# Patient Record
Sex: Female | Born: 1970 | Race: White | Hispanic: No | Marital: Married | Smoking: Never smoker
Health system: Southern US, Community
[De-identification: ages and names within clinical notes are randomized; demographics above are authoritative.]

## PROBLEM LIST (undated history)

## (undated) DIAGNOSIS — A6000 Herpesviral infection of urogenital system, unspecified: Secondary | ICD-10-CM

## (undated) DIAGNOSIS — K219 Gastro-esophageal reflux disease without esophagitis: Secondary | ICD-10-CM

## (undated) DIAGNOSIS — N900 Mild vulvar dysplasia: Secondary | ICD-10-CM

## (undated) DIAGNOSIS — Z9889 Other specified postprocedural states: Secondary | ICD-10-CM

## (undated) DIAGNOSIS — R112 Nausea with vomiting, unspecified: Secondary | ICD-10-CM

## (undated) DIAGNOSIS — N870 Mild cervical dysplasia: Secondary | ICD-10-CM

## (undated) DIAGNOSIS — R896 Abnormal cytological findings in specimens from other organs, systems and tissues: Secondary | ICD-10-CM

## (undated) HISTORY — PX: ABDOMINAL HYSTERECTOMY: SHX81

## (undated) HISTORY — DX: Abnormal cytological findings in specimens from other organs, systems and tissues: R89.6

## (undated) HISTORY — DX: Mild cervical dysplasia: N87.0

## (undated) HISTORY — PX: EYE SURGERY: SHX253

## (undated) HISTORY — PX: TOTAL ABDOMINAL HYSTERECTOMY W/ BILATERAL SALPINGOOPHORECTOMY: SHX83

## (undated) HISTORY — DX: Mild vulvar dysplasia: N90.0

---

## 2009-08-17 ENCOUNTER — Ambulatory Visit: Payer: Self-pay | Admitting: Unknown Physician Specialty

## 2010-08-02 ENCOUNTER — Ambulatory Visit: Payer: Self-pay

## 2011-07-23 ENCOUNTER — Ambulatory Visit: Payer: Self-pay

## 2015-09-15 ENCOUNTER — Other Ambulatory Visit: Payer: Self-pay

## 2015-09-15 ENCOUNTER — Encounter: Payer: Self-pay | Admitting: *Deleted

## 2015-09-15 NOTE — Patient Instructions (Signed)
  Your procedure is scheduled on:09/22/15 Report to Day Surgery. To find out your arrival time please call [phone] between 1PM - 3PM on 09/21/15.  Remember: Instructions that are not followed completely may result in serious medical risk, up to and including death, or upon the discretion of your surgeon and anesthesiologist your surgery may need to be rescheduled.    _x___ 1. Do not eat food or drink liquids after midnight. No gum chewing or hard candies.     _x__ 2. No Alcohol for 24 hours before or after surgery.   ____ 3. Bring all medications with you on the day of surgery if instructed.    __x__ 4. Notify your doctor if there is any change in your medical condition     (cold, fever, infections).     Do not wear jewelry, make-up, hairpins, clips or nail polish.  Do not wear lotions, powders, or perfumes. You may wear deodorant.  Do not shave 48 hours prior to surgery. Men may shave face and neck.  Do not bring valuables to the hospital.    [facility] is not responsible for any belongings or valuables.               Contacts, dentures or bridgework may not be worn into surgery.  Leave your suitcase in the car. After surgery it may be brought to your room.  For patients admitted to the hospital, discharge time is determined by your                treatment team.   Patients discharged the day of surgery will not be allowed to drive home.   Please read over the following fact sheets that you were given:   Surgical Site Infection Prevention   __x__ Take these medicines the morning of surgery with A SIP OF WATER:    1. Zantac the night before and the morning of surgery  2.   3.   4.  5.  6.  ____ Fleet Enema (as directed)   ____ Use CHG Soap as directed  ____ Use inhalers on the day of surgery  ____ Stop metformin 2 days prior to surgery    ____ Take 1/2 of usual insulin dose the night before surgery and none on the morning of surgery.   ____ Stop  Coumadin/Plavix/aspirin on  __x__ Stop Anti-inflammatories on tylenol only   __x__ Stop supplements until after surgery.  May continue multi/ Dit D and Calcium  ____ Bring C-Pap to the hospital.

## 2015-09-22 ENCOUNTER — Ambulatory Visit: Payer: BC Managed Care – PPO | Admitting: Certified Registered Nurse Anesthetist

## 2015-09-22 ENCOUNTER — Encounter: Payer: Self-pay | Admitting: *Deleted

## 2015-09-22 ENCOUNTER — Encounter
Admission: RE | Disposition: A | Discharge status: Home / Self Care | Payer: Self-pay | Source: Ambulatory Visit | Attending: Obstetrics and Gynecology

## 2015-09-22 ENCOUNTER — Ambulatory Visit
Admission: RE | Admit: 2015-09-22 | Discharge: 2015-09-22 | Disposition: A | Discharge status: Home / Self Care | Payer: BC Managed Care – PPO | Source: Ambulatory Visit | Attending: Obstetrics and Gynecology | Admitting: Obstetrics and Gynecology

## 2015-09-22 DIAGNOSIS — N890 Mild vaginal dysplasia: Secondary | ICD-10-CM | POA: Insufficient documentation

## 2015-09-22 DIAGNOSIS — N882 Stricture and stenosis of cervix uteri: Secondary | ICD-10-CM | POA: Insufficient documentation

## 2015-09-22 DIAGNOSIS — Z806 Family history of leukemia: Secondary | ICD-10-CM | POA: Diagnosis not present

## 2015-09-22 DIAGNOSIS — Z8262 Family history of osteoporosis: Secondary | ICD-10-CM | POA: Insufficient documentation

## 2015-09-22 DIAGNOSIS — K219 Gastro-esophageal reflux disease without esophagitis: Secondary | ICD-10-CM | POA: Diagnosis not present

## 2015-09-22 DIAGNOSIS — Z842 Family history of other diseases of the genitourinary system: Secondary | ICD-10-CM | POA: Insufficient documentation

## 2015-09-22 DIAGNOSIS — N870 Mild cervical dysplasia: Secondary | ICD-10-CM | POA: Diagnosis not present

## 2015-09-22 DIAGNOSIS — Z808 Family history of malignant neoplasm of other organs or systems: Secondary | ICD-10-CM | POA: Insufficient documentation

## 2015-09-22 DIAGNOSIS — Z833 Family history of diabetes mellitus: Secondary | ICD-10-CM | POA: Insufficient documentation

## 2015-09-22 DIAGNOSIS — Z8249 Family history of ischemic heart disease and other diseases of the circulatory system: Secondary | ICD-10-CM | POA: Insufficient documentation

## 2015-09-22 DIAGNOSIS — Z881 Allergy status to other antibiotic agents status: Secondary | ICD-10-CM | POA: Diagnosis not present

## 2015-09-22 DIAGNOSIS — Z880 Allergy status to penicillin: Secondary | ICD-10-CM | POA: Insufficient documentation

## 2015-09-22 DIAGNOSIS — Z79899 Other long term (current) drug therapy: Secondary | ICD-10-CM | POA: Insufficient documentation

## 2015-09-22 DIAGNOSIS — Z9889 Other specified postprocedural states: Secondary | ICD-10-CM

## 2015-09-22 HISTORY — DX: Herpesviral infection of urogenital system, unspecified: A60.00

## 2015-09-22 HISTORY — PX: LESION REMOVAL: SHX5196

## 2015-09-22 HISTORY — DX: Nausea with vomiting, unspecified: R11.2

## 2015-09-22 HISTORY — DX: Other specified postprocedural states: Z98.890

## 2015-09-22 HISTORY — PX: COLPOSCOPY: SHX161

## 2015-09-22 HISTORY — DX: Gastro-esophageal reflux disease without esophagitis: K21.9

## 2015-09-22 SURGERY — COLPOSCOPY
Anesthesia: General | Wound class: Clean Contaminated

## 2015-09-22 MED ORDER — DEXAMETHASONE SODIUM PHOSPHATE 4 MG/ML IJ SOLN
8.0000 mg | Freq: Once | INTRAMUSCULAR | Status: DC | PRN
  Filled 2015-09-22: qty 2

## 2015-09-22 MED ORDER — SCOPOLAMINE 1 MG/3DAYS TD PT72
MEDICATED_PATCH | TRANSDERMAL | Status: AC
  Administered 2015-09-22: 1.5 mg via TRANSDERMAL
  Filled 2015-09-22: qty 1

## 2015-09-22 MED ORDER — FENTANYL CITRATE (PF) 100 MCG/2ML IJ SOLN: 25.0000 ug | INTRAMUSCULAR | Status: DC | PRN

## 2015-09-22 MED ORDER — SCOPOLAMINE 1 MG/3DAYS TD PT72
1.0000 | MEDICATED_PATCH | Freq: Once | TRANSDERMAL | Status: DC
  Administered 2015-09-22: 1.5 mg via TRANSDERMAL

## 2015-09-22 MED ORDER — IODINE STRONG (LUGOLS) 5 % PO SOLN
ORAL | Status: DC | PRN
  Administered 2015-09-22: 2 mL

## 2015-09-22 MED ORDER — IBUPROFEN 600 MG PO TABS
600.0000 mg | ORAL_TABLET | Freq: Four times a day (QID) | ORAL | Status: DC | PRN
Start: 2015-09-22 — End: 2016-09-11

## 2015-09-22 MED ORDER — PROPOFOL 10 MG/ML IV BOLUS
INTRAVENOUS | Status: DC | PRN
  Administered 2015-09-22: 150 mg via INTRAVENOUS

## 2015-09-22 MED ORDER — HYDROCODONE-ACETAMINOPHEN 5-325 MG PO TABS: 1.0000 | ORAL_TABLET | Freq: Four times a day (QID) | ORAL | Status: DC | PRN

## 2015-09-22 MED ORDER — LACTATED RINGERS IV SOLN
INTRAVENOUS | Status: DC
Start: 2015-09-22 — End: 2015-09-22
  Administered 2015-09-22: 10:00:00 via INTRAVENOUS

## 2015-09-22 MED ORDER — LIDOCAINE HCL (CARDIAC) 20 MG/ML IV SOLN
INTRAVENOUS | Status: DC | PRN
  Administered 2015-09-22: 30 mg via INTRAVENOUS

## 2015-09-22 MED ORDER — IODINE STRONG (LUGOLS) 5 % PO SOLN
ORAL | Status: AC
  Filled 2015-09-22: qty 1

## 2015-09-22 MED ORDER — MIDAZOLAM HCL 2 MG/2ML IJ SOLN
INTRAMUSCULAR | Status: DC | PRN
  Administered 2015-09-22: 2 mg via INTRAVENOUS

## 2015-09-22 MED ORDER — FENTANYL CITRATE (PF) 100 MCG/2ML IJ SOLN
INTRAMUSCULAR | Status: DC | PRN
  Administered 2015-09-22: 100 ug via INTRAVENOUS

## 2015-09-22 MED ORDER — DEXAMETHASONE SODIUM PHOSPHATE 10 MG/ML IJ SOLN
INTRAMUSCULAR | Status: DC | PRN
  Administered 2015-09-22: 5 mg via INTRAVENOUS

## 2015-09-22 SURGICAL SUPPLY — 28 items
BLADE SURG SZ10 CARB STEEL (BLADE) ×3 IMPLANT
CANISTER SUCT 1200ML W/VALVE (MISCELLANEOUS) ×3 IMPLANT
CATH FOLEY 2WAY  5CC 16FR (CATHETERS) ×2
CATH URTH 16FR FL 2W BLN LF (CATHETERS) ×1 IMPLANT
DRAPE SHEET LG 3/4 BI-LAMINATE (DRAPES) ×3 IMPLANT
DRAPE SURG 17X11 SM STRL (DRAPES) ×3 IMPLANT
DRAPE UNDER BUTTOCK W/FLU (DRAPES) ×3 IMPLANT
ELECT LEEP BALL 5MM 12CM (MISCELLANEOUS) ×3
ELECT REM PT RETURN 9FT ADLT (ELECTROSURGICAL) ×3
ELECTRODE LEEP BALL 5MM 12CM (MISCELLANEOUS) ×1 IMPLANT
ELECTRODE REM PT RTRN 9FT ADLT (ELECTROSURGICAL) ×1 IMPLANT
GAUZE PACK 2X3YD (MISCELLANEOUS) ×3 IMPLANT
GLOVE BIO SURGEON STRL SZ7 (GLOVE) ×3 IMPLANT
GLOVE INDICATOR 7.5 STRL GRN (GLOVE) ×3 IMPLANT
GOWN STRL REUS W/ TWL LRG LVL3 (GOWN DISPOSABLE) ×2 IMPLANT
GOWN STRL REUS W/TWL LRG LVL3 (GOWN DISPOSABLE) ×4
KIT RM TURNOVER CYSTO AR (KITS) ×3 IMPLANT
NDL SAFETY 18GX1.5 (NEEDLE) ×3 IMPLANT
PACK DNC HYST (MISCELLANEOUS) ×3 IMPLANT
PAD OB MATERNITY 4.3X12.25 (PERSONAL CARE ITEMS) ×3 IMPLANT
PAD PREP 24X41 OB/GYN DISP (PERSONAL CARE ITEMS) ×3 IMPLANT
PENCIL ELECTRO HAND CTR (MISCELLANEOUS) ×3 IMPLANT
SPONGE XRAY 4X4 16PLY STRL (MISCELLANEOUS) ×3 IMPLANT
SUT VIC AB 0 CT1 27 (SUTURE) ×2
SUT VIC AB 0 CT1 27XCR 8 STRN (SUTURE) ×1 IMPLANT
SUT VIC AB 0 CT1 36 (SUTURE) ×3 IMPLANT
SYR 30ML LL (SYRINGE) ×3 IMPLANT
SYRINGE 10CC LL (SYRINGE) ×3 IMPLANT

## 2015-09-22 NOTE — Discharge Instructions (Signed)

## 2015-09-22 NOTE — Anesthesia Procedure Notes (Signed)
Procedure Name: LMA Insertion Date/Time: 09/22/2015 10:29 AM Performed by: Derinda LateIACONE, Callum Wolf Pre-anesthesia Checklist: Patient identified, Patient being monitored, Timeout performed, Emergency Drugs available and Suction available Patient Re-evaluated:Patient Re-evaluated prior to inductionOxygen Delivery Method: Circle system utilized Preoxygenation: Pre-oxygenation with 100% oxygen Intubation Type: IV induction Ventilation: Mask ventilation without difficulty LMA: LMA inserted LMA Size: 4.0 Tube type: Oral Number of attempts: 1 Placement Confirmation: positive ETCO2 and breath sounds checked- equal and bilateral Tube secured with: Tape Dental Injury: Teeth and Oropharynx as per pre-operative assessment

## 2015-09-22 NOTE — Transfer of Care (Signed)
Immediate Anesthesia Transfer of Care Note  Patient: Heidi Rodriguez  Procedure(s) Performed: Procedure(s): COLPOSCOPY (N/A) EXCISION VAGINAL LESION (N/A)  Patient Location: PACU  Anesthesia Type:General  Level of Consciousness: awake, alert  and oriented  Airway & Oxygen Therapy: Patient Spontanous Breathing  Post-op Assessment: Report given to RN and Post -op Vital signs reviewed and stable  Post vital signs: Reviewed and stable  Last Vitals:  Filed Vitals:   09/22/15 0859 09/22/15 1107  BP: 116/68   Pulse: 72   Temp: 36.7 C 36.4 C  Resp: 16     Complications: No apparent anesthesia complications

## 2015-09-22 NOTE — H&P (Signed)
Date of Initial paper H&P: 09/13/15  History reviewed, patient examined lung clear, no change in status, stable for surgery.

## 2015-09-22 NOTE — Op Note (Signed)
Preoperative Diagnosis: 1) 45 y.o.  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 with persistent LSIL paps, vaginal lesion revealing VAIN I  Anesthesia: General - LMA  Preoperative Antibiotics: none  Estimated Blood Loss: 2mL  IV Fluids: 500mL  Drains or Tubes: none  Implants: none  Specimens Removed: Left vaginal sidewall lesion, ectocervical biopsy 12 O'Clock, endocervical curettage  Complications: none  Intraoperative Findings: Slightly stenotic cervix secondary to prior LEEP, small 4-185mm condylomatous lesion on the left vaginal sidewall.  Lugols was applied to the cervix and vagina, with the exception of the lesion all other areas stained appropriately.    Patient Condition: stable  Procedure in Detail:  Patient was taken to the operating room where she was administered general anesthesia.  She was positioned in the dorsal lithotomy position utilizing Allen stirups, prepped and draped in the usual sterile fashion.  Prior to proceeding with procedure a time out was performed.  Attention was turned to the patient's pelvis.  An insulated speculum was placed to allow visualization of the cervix.  Lugol's solution was applied to the cervix and vagina with the lesion previously noted being the only non-staining area.  Given the small size of the lesion and its location, rather than using an electrocautery loop, the lesion was excised using a Kevorkian biopsy forceps.  The bed and edges of the lesion were then cauterize using a roller ball.  The cervix was grasped with a single tooth tenaculum, ECC was perfomed yielding a small amount of tissue.  In addition random 12 O'Clock  ectocervical biopsy was obtained.  Biopsy and tenaculum sites were inspected noted to be hemostatic.  The insulated speculum was then removed.  Sponge needle and instrument counts were correct time two.  The patient tolerated the procedure well and was taken to the recovery room in stable condition.

## 2015-09-22 NOTE — Anesthesia Preprocedure Evaluation (Signed)
Anesthesia Evaluation  Patient identified by MRN, date of birth, ID band Patient awake    Reviewed: Allergy & Precautions, NPO status , Patient's Chart, lab work & pertinent test results  History of Anesthesia Complications (+) PONV  Airway Mallampati: I  TM Distance: >3 FB Neck ROM: Full    Dental  (+) Teeth Intact   Pulmonary    Pulmonary exam normal        Cardiovascular Exercise Tolerance: Good negative cardio ROS Normal cardiovascular exam     Neuro/Psych    GI/Hepatic GERD  Medicated and Controlled,  Endo/Other    Renal/GU      Musculoskeletal   Abdominal Normal abdominal exam  (+)   Peds  Hematology   Anesthesia Other Findings   Reproductive/Obstetrics                             Anesthesia Physical Anesthesia Plan  ASA: I  Anesthesia Plan: General   Post-op Pain Management:    Induction: Intravenous  Airway Management Planned: LMA  Additional Equipment:   Intra-op Plan:   Post-operative Plan: Extubation in OR  Informed Consent: I have reviewed the patients History and Physical, chart, labs and discussed the procedure including the risks, benefits and alternatives for the proposed anesthesia with the patient or authorized representative who has indicated his/her understanding and acceptance.     Plan Discussed with: CRNA  Anesthesia Plan Comments:         Anesthesia Quick Evaluation

## 2015-09-22 NOTE — Anesthesia Postprocedure Evaluation (Signed)
Anesthesia Post Note  Patient: Heidi Rodriguez  Procedure(s) Performed: Procedure(s) (LRB): COLPOSCOPY (N/A) EXCISION VAGINAL LESION (N/A)  Patient location during evaluation: PACU Anesthesia Type: General Level of consciousness: awake Pain management: pain level controlled Vital Signs Assessment: post-procedure vital signs reviewed and stable Respiratory status: spontaneous breathing Cardiovascular status: blood pressure returned to baseline Postop Assessment: no headache Anesthetic complications: no    Last Vitals:  Filed Vitals:   09/22/15 1106 09/22/15 1107  BP: 109/59   Pulse: 74   Temp: 36.4 C 36.4 C  Resp: 15     Last Pain:  Filed Vitals:   09/22/15 1110  PainSc: 0-No pain                 Larraine Argo M

## 2015-09-25 LAB — SURGICAL PATHOLOGY

## 2016-08-05 ENCOUNTER — Telehealth: Payer: Self-pay | Admitting: Obstetrics and Gynecology

## 2016-08-05 ENCOUNTER — Other Ambulatory Visit: Payer: Self-pay | Admitting: Obstetrics and Gynecology

## 2016-08-05 MED ORDER — SPIRONOLACTONE 25 MG PO TABS: 50.0000 mg | ORAL_TABLET | Freq: Every day | ORAL | 3 refills | Status: DC

## 2016-08-05 MED ORDER — VALACYCLOVIR HCL 500 MG PO TABS: 500.0000 mg | ORAL_TABLET | Freq: Every day | ORAL | 3 refills | Status: DC

## 2016-08-05 NOTE — Telephone Encounter (Signed)
Has been refilled refilled until next visit which should be due in June 2018 on your desk wont let me escribe

## 2016-08-05 NOTE — Telephone Encounter (Signed)
Please refill/advise

## 2016-08-05 NOTE — Telephone Encounter (Signed)
Pt contacted office for refill request on the following medications:spironolactone (ALDACTONE) 25 MG tablet. Pt is schedule for annual 09/11/16. [facility] [place]. Cb# [phone]

## 2016-08-05 NOTE — Telephone Encounter (Signed)
Pt states she also needs a refill on her  Valtrex to get her to her appointment. KJ CMA

## 2016-09-11 ENCOUNTER — Encounter: Payer: Self-pay | Admitting: Obstetrics and Gynecology

## 2016-09-11 ENCOUNTER — Ambulatory Visit (INDEPENDENT_AMBULATORY_CARE_PROVIDER_SITE_OTHER): Payer: BC Managed Care – PPO | Admitting: Obstetrics and Gynecology

## 2016-09-11 VITALS — BP 112/62 | HR 66 | Ht 62.0 in | Wt 122.0 lb

## 2016-09-11 DIAGNOSIS — Z01419 Encounter for gynecological examination (general) (routine) without abnormal findings: Secondary | ICD-10-CM | POA: Diagnosis not present

## 2016-09-11 DIAGNOSIS — Z1231 Encounter for screening mammogram for malignant neoplasm of breast: Secondary | ICD-10-CM | POA: Diagnosis not present

## 2016-09-11 DIAGNOSIS — Z124 Encounter for screening for malignant neoplasm of cervix: Secondary | ICD-10-CM

## 2016-09-11 DIAGNOSIS — Z1239 Encounter for other screening for malignant neoplasm of breast: Secondary | ICD-10-CM

## 2016-09-11 NOTE — Patient Instructions (Signed)

## 2016-09-11 NOTE — Progress Notes (Signed)
Gynecology Annual Exam  PCP: Cyndie Mull, DO  Chief Complaint:  Chief Complaint  Patient presents with  . Gynecologic Exam    History of Present Illness: Patient is a 46 y.o. G1P1 presents for annual exam. The patient has no complaints today.   LMP: No LMP recorded. Patient has had a hysterectomy.  The patient is sexually active. She currently uses nothing (hysterectomy) for contraception. She denies dyspareunia.  The patient does perform self breast exams.  There is no notable family history of breast or ovarian cancer in her family.  The patient wears seatbelts: yes.   The patient has regular exercise: yes.    The patient denies current symptoms of depression.    The patient did have BSO with her hysterectomy, she has discontinued HRT is doing well without significant vasomotor or vaginal symptoms.  Review of Systems: ROS  Past Medical History:  Past Medical History:  Diagnosis Date  . GERD (gastroesophageal reflux disease)   . Herpes genitalis   . PONV (postoperative nausea and vomiting)     Past Surgical History:  Past Surgical History:  Procedure Laterality Date  . ABDOMINAL HYSTERECTOMY    . COLPOSCOPY N/A 09/22/2015   Procedure: COLPOSCOPY;  Surgeon: Vena Austria, MD;  Location: [facility] ORS;  Service: Gynecology;  Laterality: N/A;  . LESION REMOVAL N/A 09/22/2015   Procedure: EXCISION VAGINAL LESION;  Surgeon: Vena Austria, MD;  Location: [facility] ORS;  Service: Gynecology;  Laterality: N/A;    Gynecologic History:  No LMP recorded. Patient has had a hysterectomy. Contraception: status post hysterectomy Last Pap: Results were: LSIL HPV negative Last mammogram: 1 year ago Results were: BI-RAD I Obstetric History: G1P1  Family History:  Family History  Problem Relation Age of Onset  . Skin cancer Mother 83  . Diabetes Maternal Uncle   . Skin cancer Maternal Grandmother   . Leukemia Maternal Grandmother     Social History:  Social  History   Social History  . Marital status: Divorced    Spouse name: N/A  . Number of children: N/A  . Years of education: N/A   Occupational History  . Not on file.   Social History Main Topics  . Smoking status: Never Smoker  . Smokeless tobacco: Never Used  . Alcohol use No  . Drug use: No  . Sexual activity: Yes    Birth control/ protection: Surgical     Comment: Hysterectomy   Other Topics Concern  . Not on file   Social History Narrative  . No narrative on file    Allergies:  Allergies  Allergen Reactions  . Erythromycin Hives  . Keflex [Cephalexin] Hives    Medications: Prior to Admission medications   Medication Sig Start Date End Date Taking? Authorizing Provider  Biotin 10 MG CAPS Take by mouth.   Yes Historical Provider, MD  calcium carbonate 1250 MG capsule Take 1,250 mg by mouth daily.   Yes Historical Provider, MD  fluticasone (FLONASE) 50 MCG/ACT nasal spray INHALE 1 SPRAY (50 MCG) IN EACH NOSTRIL BY INTRANASAL ROUTE 2 TIMES PER DAY 07/08/16  Yes Historical Provider, MD  Lactobacillus Acid-Pectin (ACIDOPHILUS/PECTIN) CAPS Take by mouth.   Yes Historical Provider, MD  Multiple Vitamin (MULTIVITAMIN) capsule Take 1 capsule by mouth daily.   Yes Historical Provider, MD  naftifine (NAFTIN) 1 % cream Apply 1 application topically daily. Between toes   Yes Historical Provider, MD  spironolactone (ALDACTONE) 25 MG tablet Take 2 tablets (50 mg total) by  mouth daily. 08/05/16  Yes Vena Austria, MD  valACYclovir (VALTREX) 500 MG tablet Take 1 tablet (500 mg total) by mouth daily. 08/05/16  Yes Vena Austria, MD  vitamin C (ASCORBIC ACID) 500 MG tablet Take by mouth.   Yes Historical Provider, MD  Vitamin D, Ergocalciferol, (DRISDOL) 50000 units CAPS capsule Take 50,000 Units by mouth daily.   Yes Historical Provider, MD    Physical Exam Vitals: Blood pressure 112/62, pulse 66, height  (1.575 m), weight 122 lb (55.3 kg).  General: NAD HEENT:  normocephalic, anicteric Thyroid: no enlargement, no palpable nodules Pulmonary: No increased work of breathing, CTAB Cardiovascular: RRR, distal pulses 2+ Breast: Breast symmetrical, no tenderness, no palpable nodules or masses, no skin or nipple retraction present, no nipple discharge.  No axillary or supraclavicular lymphadenopathy. Abdomen: NABS, soft, non-tender, non-distended.  Umbilicus without lesions.  No hepatomegaly, splenomegaly or masses palpable. No evidence of hernia  Genitourinary:  External: Normal external female genitalia.  Normal urethral meatus, normal  Bartholin's and Skene's glands.    Vagina: Normal vaginal mucosa, no evidence of prolapse.    Cervix: Grossly normal in appearance, no bleeding  Uterus: Non-enlarged, mobile, normal contour.  No CMT  Adnexa: ovaries non-enlarged, no adnexal masses  Rectal: deferred  Lymphatic: no evidence of inguinal lymphadenopathy Extremities: no edema, erythema, or tenderness Neurologic: Grossly intact Psychiatric: mood appropriate, affect full  Female chaperone present for pelvic and breast  portions of the physical exam    Assessment: 46 y.o. G1P1 routine annual exam  Plan: Problem List Items Addressed This Visit    None    Visit Diagnoses    Encounter for annual routine gynecological examination    -  Primary   Screening for malignant neoplasm of cervix       Relevant Orders   PapIG, HPV, rfx 16/18   Breast screening       Relevant Orders   MM DIGITAL SCREENING BILATERAL      1) Mammogram - recommend yearly screening mammogram.  Mammogram Was ordered today   2) STI screening was offered and declined  3) ASCCP guidelines and rational discussed.  Given history of VAIN I last year will obtain pap today but if normal consider discontinuation  4) Contraception - N/A  5) Routine healthcare maintenance including cholesterol, diabetes screening discussed managed by PCP   6) DEXA scan - vitamin D and calcium  supplementation.  Consider early DEXA at age 70 given surgical menopause  7) RTC 1 year for annual exam

## 2016-09-13 LAB — HPV, LOW VOLUME (REFLEX)

## 2016-09-13 LAB — PAPIG, HPV, RFX 16/18: PAP SMEAR COMMENT: 0

## 2016-09-26 ENCOUNTER — Ambulatory Visit: Payer: BC Managed Care – PPO

## 2016-09-28 ENCOUNTER — Other Ambulatory Visit: Payer: Self-pay | Admitting: Obstetrics and Gynecology

## 2016-09-30 NOTE — Telephone Encounter (Signed)
AMS, pt has a Colposcopy scheduled with you in May. Please advise on refill. KJ CMA

## 2016-10-02 ENCOUNTER — Ambulatory Visit: Payer: BC Managed Care – PPO

## 2016-10-17 ENCOUNTER — Encounter: Payer: Self-pay | Admitting: Obstetrics and Gynecology

## 2016-10-17 ENCOUNTER — Ambulatory Visit (INDEPENDENT_AMBULATORY_CARE_PROVIDER_SITE_OTHER): Payer: BC Managed Care – PPO | Admitting: Obstetrics and Gynecology

## 2016-10-17 VITALS — BP 116/70 | HR 61 | Wt 119.0 lb

## 2016-10-17 DIAGNOSIS — R87620 Atypical squamous cells of undetermined significance on cytologic smear of vagina (ASC-US): Secondary | ICD-10-CM | POA: Diagnosis not present

## 2016-10-17 DIAGNOSIS — N890 Mild vaginal dysplasia: Secondary | ICD-10-CM

## 2016-10-17 NOTE — Progress Notes (Signed)
GYNECOLOGY CLINIC COLPOSCOPY PROCEDURE NOTE  46 y.o. G1P1 here for colposcopy for atypical squamous cellularity of undetermined significance (ASCUS) pap smear on 1 month ago. Discussed underlying role for HPV infection in the development of cervical dysplasia, its natural history and progression/regression, need for surveillance.  Is the patient  pregnant: No LMP: No LMP recorded. Patient has had a hysterectomy.  Metrics: Intervention Frequency ACO  Documented Smoking Status Yearly  Screened one or more times in 24 months  Cessation Counseling or  Active cessation medication Past 24 months  Past 24 months   Guideline developer: UpToDate (See UpToDate for funding source) Date Released: 2014   Patient given informed consent, signed copy in the chart, time out was performed.  The patient was position in dorsal lithotomy position. Speculum was placed the cervix was visualized.   After application of acetic acid colposcopic inspection of the cervix was undertaken.   Colposcopy adequate, full visualization of transformation zone: transformation zone retracted, able to visualize using small Q-tip to hold os open no visible lesions; corresponding biopsies obtained.   ECC specimen obtained:  Yes limited as not much cervical length given prior supracervical hysterectomy All specimens were labeled and sent to pathology.   Patient was given post procedure instructions.  Will follow up pathology and manage accordingly.  Routine preventative health maintenance measures emphasized.  Physical Exam  Genitourinary:      Vena AustriaAndreas Lovena Kluck, MD, Merlinda FrederickFACOG [facility], [facility]

## 2016-10-21 ENCOUNTER — Encounter: Payer: Self-pay | Admitting: Obstetrics and Gynecology

## 2016-10-21 LAB — PATHOLOGY

## 2016-10-30 ENCOUNTER — Ambulatory Visit
Admission: RE | Admit: 2016-10-30 | Discharge: 2016-10-30 | Disposition: A | Discharge status: Home / Self Care | Payer: BC Managed Care – PPO | Source: Ambulatory Visit | Attending: Obstetrics and Gynecology | Admitting: Obstetrics and Gynecology

## 2016-10-30 DIAGNOSIS — Z1239 Encounter for other screening for malignant neoplasm of breast: Secondary | ICD-10-CM

## 2016-10-30 DIAGNOSIS — Z1231 Encounter for screening mammogram for malignant neoplasm of breast: Secondary | ICD-10-CM | POA: Insufficient documentation

## 2016-11-01 ENCOUNTER — Encounter: Payer: Self-pay | Admitting: Obstetrics and Gynecology

## 2016-12-02 ENCOUNTER — Other Ambulatory Visit: Payer: Self-pay | Admitting: Obstetrics and Gynecology

## 2016-12-02 NOTE — Telephone Encounter (Signed)
Most recently seen by AMS. Would you be able to refill this due to [facility] out of the office? Thank you.

## 2016-12-04 ENCOUNTER — Other Ambulatory Visit: Payer: Self-pay | Admitting: Obstetrics and Gynecology

## 2016-12-11 ENCOUNTER — Encounter: Payer: Self-pay | Admitting: Obstetrics and Gynecology

## 2016-12-12 ENCOUNTER — Other Ambulatory Visit: Payer: Self-pay | Admitting: Obstetrics and Gynecology

## 2016-12-12 NOTE — Telephone Encounter (Signed)
Pt needs gel not crm. Corrected Rx eRxd.

## 2016-12-12 NOTE — Telephone Encounter (Deleted)
You last prescribed this. Thank you.

## 2016-12-12 NOTE — Telephone Encounter (Signed)
Please advise for refill. Thank you.  

## 2016-12-22 ENCOUNTER — Other Ambulatory Visit: Payer: Self-pay | Admitting: Obstetrics and Gynecology

## 2016-12-23 NOTE — Telephone Encounter (Signed)
Colpo was 10/2016. Please advise

## 2017-01-03 ENCOUNTER — Other Ambulatory Visit: Payer: Self-pay

## 2017-01-03 MED ORDER — FLUTICASONE PROPIONATE 50 MCG/ACT NA SUSP: 2.0000 | Freq: Two times a day (BID) | NASAL | 1 refills | Status: DC

## 2017-01-07 ENCOUNTER — Other Ambulatory Visit: Payer: Self-pay | Admitting: Obstetrics and Gynecology

## 2017-02-05 ENCOUNTER — Other Ambulatory Visit: Payer: Self-pay | Admitting: Obstetrics and Gynecology

## 2017-04-03 ENCOUNTER — Other Ambulatory Visit: Payer: Self-pay | Admitting: Obstetrics and Gynecology

## 2017-04-29 ENCOUNTER — Other Ambulatory Visit: Payer: Self-pay

## 2017-04-29 MED ORDER — VALACYCLOVIR HCL 500 MG PO TABS: 500.0000 mg | ORAL_TABLET | Freq: Every day | ORAL | 3 refills | Status: DC

## 2017-07-08 ENCOUNTER — Ambulatory Visit: Payer: Self-pay | Admitting: Family Medicine

## 2017-07-17 ENCOUNTER — Ambulatory Visit: Payer: Self-pay | Admitting: Family Medicine

## 2017-08-25 ENCOUNTER — Other Ambulatory Visit: Payer: Self-pay | Admitting: Obstetrics and Gynecology

## 2017-08-25 MED ORDER — FLUTICASONE PROPIONATE 50 MCG/ACT NA SUSP: 2.0000 | Freq: Two times a day (BID) | NASAL | 0 refills | Status: DC

## 2017-08-25 NOTE — Progress Notes (Signed)
Rx RF till annual 

## 2017-09-04 ENCOUNTER — Ambulatory Visit: Payer: Self-pay | Admitting: Family Medicine

## 2017-09-16 ENCOUNTER — Ambulatory Visit (INDEPENDENT_AMBULATORY_CARE_PROVIDER_SITE_OTHER): Payer: BC Managed Care – PPO | Admitting: Obstetrics and Gynecology

## 2017-09-16 ENCOUNTER — Encounter: Payer: Self-pay | Admitting: Obstetrics and Gynecology

## 2017-09-16 VITALS — BP 96/64 | Ht 62.0 in | Wt 114.0 lb

## 2017-09-16 DIAGNOSIS — Z1231 Encounter for screening mammogram for malignant neoplasm of breast: Secondary | ICD-10-CM | POA: Diagnosis not present

## 2017-09-16 DIAGNOSIS — Z01419 Encounter for gynecological examination (general) (routine) without abnormal findings: Secondary | ICD-10-CM

## 2017-09-16 DIAGNOSIS — Z124 Encounter for screening for malignant neoplasm of cervix: Secondary | ICD-10-CM

## 2017-09-16 DIAGNOSIS — Z1239 Encounter for other screening for malignant neoplasm of breast: Secondary | ICD-10-CM

## 2017-09-16 MED ORDER — VALACYCLOVIR HCL 500 MG PO TABS: 500.0000 mg | ORAL_TABLET | Freq: Every day | ORAL | 3 refills | Status: DC

## 2017-09-16 NOTE — Patient Instructions (Signed)
Preventive Care 18-39 Years, Female Preventive care refers to lifestyle choices and visits with your health care provider that can promote health and wellness. What does preventive care include?  A yearly physical exam. This is also called an annual well check.  Dental exams once or twice a year.  Routine eye exams. Ask your health care provider how often you should have your eyes checked.  Personal lifestyle choices, including: ? Daily care of your teeth and gums. ? Regular physical activity. ? Eating a healthy diet. ? Avoiding tobacco and drug use. ? Limiting alcohol use. ? Practicing safe sex. ? Taking vitamin and mineral supplements as recommended by your health care provider. What happens during an annual well check? The services and screenings done by your health care provider during your annual well check will depend on your age, overall health, lifestyle risk factors, and family history of disease. Counseling Your health care provider may ask you questions about your:  Alcohol use.  Tobacco use.  Drug use.  Emotional well-being.  Home and relationship well-being.  Sexual activity.  Eating habits.  Work and work Statistician.  Method of birth control.  Menstrual cycle.  Pregnancy history.  Screening You may have the following tests or measurements:  Height, weight, and BMI.  Diabetes screening. This is done by checking your blood sugar (glucose) after you have not eaten for a while (fasting).  Blood pressure.  Lipid and cholesterol levels. These may be checked every 5 years starting at age 66.  Skin check.  Hepatitis C blood test.  Hepatitis B blood test.  Sexually transmitted disease (STD) testing.  BRCA-related cancer screening. This may be done if you have a family history of breast, ovarian, tubal, or peritoneal cancers.  Pelvic exam and Pap test. This may be done every 3 years starting at age 40. Starting at age 59, this may be done every 5  years if you have a Pap test in combination with an HPV test.  Discuss your test results, treatment options, and if necessary, the need for more tests with your health care provider. Vaccines Your health care provider may recommend certain vaccines, such as:  Influenza vaccine. This is recommended every year.  Tetanus, diphtheria, and acellular pertussis (Tdap, Td) vaccine. You may need a Td booster every 10 years.  Varicella vaccine. You may need this if you have not been vaccinated.  HPV vaccine. If you are 69 or younger, you may need three doses over 6 months.  Measles, mumps, and rubella (MMR) vaccine. You may need at least one dose of MMR. You may also need a second dose.  Pneumococcal 13-valent conjugate (PCV13) vaccine. You may need this if you have certain conditions and were not previously vaccinated.  Pneumococcal polysaccharide (PPSV23) vaccine. You may need one or two doses if you smoke cigarettes or if you have certain conditions.  Meningococcal vaccine. One dose is recommended if you are age 27-21 years and a first-year college student living in a residence hall, or if you have one of several medical conditions. You may also need additional booster doses.  Hepatitis A vaccine. You may need this if you have certain conditions or if you travel or work in places where you may be exposed to hepatitis A.  Hepatitis B vaccine. You may need this if you have certain conditions or if you travel or work in places where you may be exposed to hepatitis B.  Haemophilus influenzae type b (Hib) vaccine. You may need this if  you have certain risk factors.  Talk to your health care provider about which screenings and vaccines you need and how often you need them. This information is not intended to replace advice given to you by your health care provider. Make sure you discuss any questions you have with your health care provider. Document Released: 07/16/2001 Document Revised: 02/07/2016  Document Reviewed: 03/21/2015 Elsevier Interactive Patient Education  [employer].

## 2017-09-16 NOTE — Progress Notes (Signed)
Gynecology Annual Exam  PCP: Dorcas Carrow, DO  Chief Complaint:  Chief Complaint  Patient presents with  . Gynecologic Exam    History of Present Illness: Patient is a 47 y.o. G1P1 presents for annual exam. The patient has no complaints today.   LMP: No LMP recorded. Patient has had a hysterectomy. and BSO 08/17/2009 for menorrhagia, bilateral dermoid cysts.  Has never had issues with vasomotor symptoms.    The patient is sexually active. She currently uses status post hysterectomy for contraception. She denies dyspareunia.  The patient does perform self breast exams.  There is no notable family history of breast or ovarian cancer in her family.  The patient wears seatbelts: yes.   The patient has regular exercise: yes.    The patient denies current symptoms of depression.    Review of Systems: Review of Systems  Constitutional: Negative for chills and fever.  HENT: Negative for congestion.   Respiratory: Negative for cough and shortness of breath.   Cardiovascular: Negative for chest pain and palpitations.  Gastrointestinal: Negative for abdominal pain, constipation, diarrhea, heartburn, nausea and vomiting.  Genitourinary: Negative for dysuria, frequency and urgency.  Skin: Negative for itching and rash.  Neurological: Negative for dizziness and headaches.  Endo/Heme/Allergies: Negative for polydipsia.  Psychiatric/Behavioral: Negative for depression.    Past Medical History:  Past Medical History:  Diagnosis Date  . GERD (gastroesophageal reflux disease)   . Herpes genitalis   . PONV (postoperative nausea and vomiting)     Past Surgical History:  Past Surgical History:  Procedure Laterality Date  . ABDOMINAL HYSTERECTOMY    . COLPOSCOPY N/A 09/22/2015   Procedure: COLPOSCOPY;  Surgeon: Vena Austria, MD;  Location: [facility];  Service: Gynecology;  Laterality: N/A;  . LESION REMOVAL N/A 09/22/2015   Procedure: EXCISION VAGINAL LESION;  Surgeon: Vena Austria, MD;  Location: [facility];  Service: Gynecology;  Laterality: N/A;    Gynecologic History:  No LMP recorded. Patient has had a hysterectomy. Contraception: status post hysterectomy Last Pap: Results were: 09/11/2016 ASCUS with negative colposcopy 10/17/2016  Last mammogram: 10/30/2016 Results were: BI-RAD I  Obstetric History: G1P1  Family History:  Family History  Problem Relation Age of Onset  . Skin cancer Mother 64  . Diabetes Maternal Uncle   . Skin cancer Maternal Grandmother   . Leukemia Maternal Grandmother   . Breast cancer Neg Hx     Social History:  Social History   Socioeconomic History  . Marital status: Divorced    Spouse name: Not on file  . Number of children: Not on file  . Years of education: Not on file  . Highest education level: Not on file  Occupational History  . Not on file  Social Needs  . Financial resource strain: Not on file  . Food insecurity:    Worry: Not on file    Inability: Not on file  . Transportation needs:    Medical: Not on file    Non-medical: Not on file  Tobacco Use  . Smoking status: Never Smoker  . Smokeless tobacco: Never Used  Substance and Sexual Activity  . Alcohol use: No  . Drug use: No  . Sexual activity: Yes    Birth control/protection: Surgical    Comment: Hysterectomy  Lifestyle  . Physical activity:    Days per week: Not on file    Minutes per session: Not on file  . Stress: Not on file  Relationships  . Social connections:  Talks on phone: Not on file    Gets together: Not on file    Attends religious service: Not on file    Active member of club or organization: Not on file    Attends meetings of clubs or organizations: Not on file    Relationship status: Not on file  . Intimate partner violence:    Fear of current or ex partner: Not on file    Emotionally abused: Not on file    Physically abused: Not on file    Forced sexual activity: Not on file  Other Topics Concern  . Not on file    Social History Narrative  . Not on file    Allergies:  Allergies  Allergen Reactions  . Erythromycin Hives  . Keflex [Cephalexin] Hives    Medications: Prior to Admission medications   Medication Sig Start Date End Date Taking? Authorizing Provider  Biotin 10 MG CAPS Take by mouth.    [provider]  calcium carbonate 1250 MG capsule Take 1,250 mg by mouth daily.    [provider]  fluticasone (FLONASE) 50 MCG/ACT nasal spray Place 2 sprays into both nostrils 2 (two) times daily. 08/25/17   Copland, Alicia B, PA-C  Lactobacillus Acid-Pectin (ACIDOPHILUS/PECTIN) CAPS Take by mouth.    [provider]  Multiple Vitamin (MULTIVITAMIN) capsule Take 1 capsule by mouth daily.    [provider]  naftifine (NAFTIN) 1 % cream APPLY TO THE AFFECTED AND SURROUNDING AREAS OF SKIN BY TOPICAL ROUTE ONCE DAILY 12/02/16   Vena AustriaStaebler, Keona Sheffler, MD  NAFTIN 2 % GEL APPLY DAILY TO AFFECTED AREA(S) PLUS A 0.5 INCH MARGIN TO HEALTHY, SURROUNDING SKIN FOR 2 WEEKS 12/12/16   Copland, Helmut MusterAlicia B, PA-C  spironolactone (ALDACTONE) 25 MG tablet TAKE 2 TABLETS BY MOUTH DAILY 12/23/16   Vena AustriaStaebler, Jalayah Gutridge, MD  valACYclovir (VALTREX) 500 MG tablet Take 1 tablet (500 mg total) by mouth daily. 04/29/17   Vena AustriaStaebler, Amylynn Fano, MD  vitamin C (ASCORBIC ACID) 500 MG tablet Take by mouth.    [provider]  Vitamin D, Ergocalciferol, (DRISDOL) 50000 units CAPS capsule Take 50,000 Units by mouth daily.    [provider]    Physical Exam Blood pressure 96/64, height 5\' 2"  (1.575 m), weight 114 lb (51.7 kg).   General: NAD HEENT: normocephalic, anicteric Thyroid: no enlargement, no palpable nodules Pulmonary: No increased work of breathing, CTAB Cardiovascular: RRR, distal pulses 2+ Breast: Breast symmetrical, no tenderness, no palpable nodules or masses, no skin or nipple retraction present, no nipple discharge.  No axillary or supraclavicular lymphadenopathy. Abdomen:  NABS, soft, non-tender, non-distended.  Umbilicus without lesions.  No hepatomegaly, splenomegaly or masses palpable. No evidence of hernia  Genitourinary:  External: Normal external female genitalia.  Normal urethral meatus, normal Bartholin's and Skene's glands.    Vagina: Normal vaginal mucosa, no evidence of prolapse.    Cervix: Grossly normal in appearance, no bleeding  Uterus: surgically sbent  Adnexa: ovaries surgically absent, no adnexal masses  Rectal: deferred  Lymphatic: no evidence of inguinal lymphadenopathy Extremities: no edema, erythema, or tenderness Neurologic: Grossly intact Psychiatric: mood appropriate, affect full  Female chaperone present for pelvic and breast  portions of the physical exam    Assessment: 47 y.o. G1P1 routine annual exam  Plan: Problem List Items Addressed This Visit    None    Visit Diagnoses    Screening for malignant neoplasm of cervix    -  Primary   Relevant Orders   PapIG, HPV,  rfx 16/18   Breast screening       Relevant Orders   MM DIGITAL SCREENING BILATERAL   Encounter for gynecological examination without abnormal finding       Relevant Orders   PapIG, HPV, rfx 16/18      1) Mammogram - recommend yearly screening mammogram.  Mammogram Was ordered today   2) STI screening  was notoffered and therefore not obtained  3) ASCCP guidelines and rational discussed.  Patient opts for every 3 years screening interval - Follow up ASCUS pap last year  4) Contraception - the patient is currently using  status post hysterectomy.    5) Colonoscopy -- Screening recommended starting at age 40  6) Routine healthcare maintenance including cholesterol, diabetes screening discussed managed by PCP  7) Refill - rx valtrex provided  8) Consider early bone density before age 25 (hysterectomy at age 40 with BSO)  58)  Return in 1 year (on 09/17/2018) for routine annual.   Vena Austria, MD, [facility], [facility] 09/16/2017, 9:21 AM

## 2017-09-19 LAB — PAPIG, HPV, RFX 16/18: PAP SMEAR COMMENT: 0

## 2017-09-19 LAB — HPV, LOW VOLUME (REFLEX): HPV low volume reflex: NEGATIVE

## 2017-09-25 ENCOUNTER — Other Ambulatory Visit: Payer: Self-pay | Admitting: Obstetrics and Gynecology

## 2017-09-26 NOTE — Telephone Encounter (Signed)
Please advise. Thank you

## 2017-09-29 ENCOUNTER — Encounter (INDEPENDENT_AMBULATORY_CARE_PROVIDER_SITE_OTHER): Payer: Self-pay

## 2017-10-14 ENCOUNTER — Ambulatory Visit: Payer: BC Managed Care – PPO | Admitting: Family Medicine

## 2017-10-20 ENCOUNTER — Ambulatory Visit (INDEPENDENT_AMBULATORY_CARE_PROVIDER_SITE_OTHER): Payer: BC Managed Care – PPO | Admitting: Family Medicine

## 2017-10-20 VITALS — BP 91/61 | HR 65 | Temp 97.4°F | Ht 61.0 in | Wt 115.7 lb

## 2017-10-20 DIAGNOSIS — Z23 Encounter for immunization: Secondary | ICD-10-CM | POA: Diagnosis not present

## 2017-10-20 DIAGNOSIS — R87619 Unspecified abnormal cytological findings in specimens from cervix uteri: Secondary | ICD-10-CM

## 2017-10-20 DIAGNOSIS — Z8619 Personal history of other infectious and parasitic diseases: Secondary | ICD-10-CM | POA: Insufficient documentation

## 2017-10-20 NOTE — Patient Instructions (Signed)
HPV (Human Papillomavirus) Vaccine: What You Need to Know  1. Why get vaccinated?  HPV vaccine prevents infection with human papillomavirus (HPV) types that are associated with many cancers, including:  · cervical cancer in females,  · vaginal and vulvar cancers in females,  · anal cancer in females and males,  · throat cancer in females and males, and  · penile cancer in males.    In addition, HPV vaccine prevents infection with HPV types that cause genital warts in both females and males.  In the [place] about 12,000 women get cervical cancer every year, and about 4,000 women die from it. HPV vaccine can prevent most of these cases of cervical cancer.  Vaccination is not a substitute for cervical cancer screening. This vaccine does not protect against all HPV types that can cause cervical cancer. Women should still get regular Pap tests.  HPV infection usually comes from sexual contact, and most people will become infected at some point in their life. About 14 million Americans, including teens, get infected every year. Most infections will go away on their own and not cause serious problems. But thousands of women and men get cancer and other diseases from HPV.  2. HPV vaccine  HPV vaccine is approved by FDA and is recommended by CDC for both males and females. It is routinely given at 11 or 45 years of age, but it may be given beginning at age 9 years through age 26 years.  Most adolescents 9 through 45 years of age should get HPV vaccine as a two-dose series with the doses separated by 6-12 months. People who start HPV vaccination at 15 years of age and older should get the vaccine as a three-dose series with the second dose given 1-2 months after the first dose and the third dose given 6 months after the first dose. There are several exceptions to these age recommendations. Your health care provider can give you more information.  3. Some people should not get this vaccine   · Anyone who has had a severe (life-threatening) allergic reaction to a dose of HPV vaccine should not get another dose.  · Anyone who has a severe (life threatening) allergy to any component of HPV vaccine should not get the vaccine.  · Tell your doctor if you have any severe allergies that you know of, including a severe allergy to yeast.  · HPV vaccine is not recommended for pregnant women. If you learn that you were pregnant when you were vaccinated, there is no reason to expect any problems for you or your baby. Any woman who learns she was pregnant when she got HPV vaccine is encouraged to contact the manufacturer's registry for HPV vaccination during pregnancy at 1-800-986-8999. Women who are breastfeeding may be vaccinated.  · If you have a mild illness, such as a cold, you can probably get the vaccine today. If you are moderately or severely ill, you should probably wait until you recover. Your doctor can advise you.  4. Risks of a vaccine reaction  With any medicine, including vaccines, there is a chance of side effects. These are usually mild and go away on their own, but serious reactions are also possible.  Most people who get HPV vaccine do not have any serious problems with it.  Mild or moderate problems following HPV vaccine:  · Reactions in the arm where the shot was given:  ? Soreness (about 9 people in 10)  ? Redness or swelling (about 1 person   in 3)  · Fever:  ? Mild (100°F) (about 1 person in 10)  ? Moderate (102°F) (about 1 person in 65)  · Other problems:  ? Headache (about 1 person in 3)  Problems that could happen after any injected vaccine:  · People sometimes faint after a medical procedure, including vaccination. Sitting or lying down for about 15 minutes can help prevent fainting, and injuries caused by a fall. Tell your doctor if you feel dizzy, or have vision changes or ringing in the ears.  · Some people get severe pain in the shoulder and have difficulty moving  the arm where a shot was given. This happens very rarely.  · Any medication can cause a severe allergic reaction. Such reactions from a vaccine are very rare, estimated at about 1 in a million doses, and would happen within a few minutes to a few hours after the vaccination.  As with any medicine, there is a very remote chance of a vaccine causing a serious injury or death.  The safety of vaccines is always being monitored. For more information, visit: www.cdc.gov/vaccinesafety/.  5. What if there is a serious reaction?  What should I look for?  Look for anything that concerns you, such as signs of a severe allergic reaction, very high fever, or unusual behavior.  Signs of a severe allergic reaction can include hives, swelling of the face and throat, difficulty breathing, a fast heartbeat, dizziness, and weakness. These would usually start a few minutes to a few hours after the vaccination.  What should I do?  If you think it is a severe allergic reaction or other emergency that can't wait, call 9-1-1 or get to the nearest hospital. Otherwise, call your doctor.  Afterward, the reaction should be reported to the Vaccine Adverse Event Reporting System (VAERS). Your doctor should file this report, or you can do it yourself through the VAERS web site at www.vaers.hhs.gov, or by calling 1-800-822-7967.  VAERS does not give medical advice.  6. The National Vaccine Injury Compensation Program  The National Vaccine Injury Compensation Program (VICP) is a federal program that was created to compensate people who may have been injured by certain vaccines.  Persons who believe they may have been injured by a vaccine can learn about the program and about filing a claim by calling 1-800-338-2382 or visiting the VICP website at www.hrsa.gov/vaccinecompensation. There is a time limit to file a claim for compensation.  7. How can I learn more?  · Ask your health care provider. He or she can give you the vaccine  package insert or suggest other sources of information.  · Call your local or state health department.  · Contact the Centers for Disease Control and Prevention (CDC):  ? Call 1-800-232-4636 (1-800-CDC-INFO) or  ? Visit CDC’s website at www.cdc.gov/hpv  Vaccine Information Statement, HPV Vaccine (05/05/2015)  This information is not intended to replace advice given to you by your health care provider. Make sure you discuss any questions you have with your health care provider.  Document Released: 12/15/2013 Document Revised: 02/08/2016 Document Reviewed: 02/08/2016  Elsevier Interactive Patient Education © 2017 Elsevier Inc.

## 2017-10-20 NOTE — Progress Notes (Signed)
BP 91/61 (BP Location: Right Arm, Patient Position: Sitting, Cuff Size: Normal)   Pulse 65   Temp (!) 97.4 F (36.3 C) (Oral)   Ht  (1.549 m)   Wt 115 lb 11.2 oz (52.5 kg)   SpO2 100%   BMI 21.86 kg/m    Subjective:    Patient ID: Heidi Rodriguez, female    DOB: July 23, 1970, 47 y.o.   MRN: [id number]  HPI: Heidi Rodriguez is a 47 y.o. female  Chief Complaint  Patient presents with  . Establish Care   Had hysterectomy for benign reasons, but still has part of her cervix, so getting paps through GYN. Has been seeing them every 6 months recently. She is otherwise healthy with no other medical problems. Had her last physical in October.   Active Ambulatory Problems    Diagnosis Date Noted  . 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 Abnormal Pap smear of cervix 10/20/2017   Resolved Ambulatory Problems    Diagnosis Date Noted  . No Resolved Ambulatory Problems   Past Medical History:  Diagnosis Date  . Abnormal cytology   . CIN I (cervical intraepithelial neoplasia I)   . GERD (gastroesophageal reflux disease)   . Herpes genitalis   . PONV (postoperative nausea and vomiting)   . Vulvar intraepithelial neoplasia (VIN) grade 1    Past Surgical History:  Procedure Laterality Date  . ABDOMINAL HYSTERECTOMY    . COLPOSCOPY N/A 09/22/2015   Procedure: COLPOSCOPY;  Surgeon: Vena Austria, MD;  Location: [facility];  Service: Gynecology;  Laterality: N/A;  . LESION REMOVAL N/A 09/22/2015   Procedure: EXCISION VAGINAL LESION;  Surgeon: Vena Austria, MD;  Location: [facility];  Service: Gynecology;  Laterality: N/A;   Outpatient Encounter Medications as of 10/20/2017  Medication Sig  . Biotin 10 MG CAPS Take by mouth.  . calcium carbonate 1250 MG capsule Take 1,250 mg by mouth daily.  . Cholecalciferol (VITAMIN D3) 2000 units capsule Take by mouth.  . fluticasone (FLONASE) 50 MCG/ACT nasal spray PLACE 2 SPRAYS INTO BOTH NOSTRILS 2 (TWO) TIMES DAILY.  . Lactobacillus  Acid-Pectin (ACIDOPHILUS/PECTIN) CAPS Take by mouth.  . Multiple Vitamin (MULTIVITAMIN) capsule Take 1 capsule by mouth daily.  Marland Kitchen NAFTIN 2 % GEL APPLY DAILY TO AFFECTED AREA(S) PLUS A 0.5 INCH MARGIN TO HEALTHY, SURROUNDING SKIN FOR 2 WEEKS  . spironolactone (ALDACTONE) 50 MG tablet 2(TWO) TABLET(S) ORAL EVERY DAY  . valACYclovir (VALTREX) 500 MG tablet Take 1 tablet (500 mg total) by mouth daily.  . vitamin C (ASCORBIC ACID) 500 MG tablet Take by mouth.  . Vitamin D, Ergocalciferol, (DRISDOL) 50000 units CAPS capsule Take 50,000 Units by mouth daily.   No facility-administered encounter medications on file as of 10/20/2017.    Allergies  Allergen Reactions  . Erythromycin Hives  . Keflex [Cephalexin] Hives  . Penicillin G     Other reaction(s): Unknown   Social History   Socioeconomic History  . Marital status: Divorced    Spouse name: Not on file  . Number of children: Not on file  . Years of education: Not on file  . Highest education level: Not on file  Occupational History  . Not on file  Social Needs  . Financial resource strain: Not on file  . Food insecurity:    Worry: Not on file    Inability: Not on file  . Transportation needs:    Medical: Not on file    Non-medical: Not on file  Tobacco Use  .  Smoking status: Never Smoker  . Smokeless tobacco: Never Used  Substance and Sexual Activity  . Alcohol use: No  . Drug use: No  . Sexual activity: Yes    Birth control/protection: Surgical    Comment: Hysterectomy  Lifestyle  . Physical activity:    Days per week: Not on file    Minutes per session: Not on file  . Stress: Not on file  Relationships  . Social connections:    Talks on phone: Not on file    Gets together: Not on file    Attends religious service: Not on file    Active member of club or organization: Not on file    Attends meetings of clubs or organizations: Not on file    Relationship status: Not on file  Other Topics Concern  . Not on file    Social History Narrative  . Not on file   Family History  Problem Relation Age of Onset  . Skin cancer Mother 3  . Glaucoma Mother   . Diabetes Maternal Uncle   . Skin cancer Maternal Grandmother   . Leukemia Maternal Grandmother   . CAD Maternal Grandmother   . Glaucoma Maternal Grandmother   . CAD Father   . Hyperlipidemia Father   . Obesity Sister   . Osteoarthritis Sister   . Breast cancer Neg Hx      Review of Systems  Constitutional: Negative.   Respiratory: Negative.   Cardiovascular: Negative.   Genitourinary: Negative.   Psychiatric/Behavioral: Negative.     Per HPI unless specifically indicated above     Objective:    BP 91/61 (BP Location: Right Arm, Patient Position: Sitting, Cuff Size: Normal)   Pulse 65   Temp (!) 97.4 F (36.3 C) (Oral)   Ht  (1.549 m)   Wt 115 lb 11.2 oz (52.5 kg)   SpO2 100%   BMI 21.86 kg/m   Wt Readings from Last 3 Encounters:  10/20/17 115 lb 11.2 oz (52.5 kg)  09/16/17 114 lb (51.7 kg)  10/17/16 119 lb (54 kg)    Physical Exam  Constitutional: She is oriented to person, place, and time. She appears well-developed and well-nourished. No distress.  HENT:  Head: Normocephalic and atraumatic.  Right Ear: Hearing normal.  Left Ear: Hearing normal.  Nose: Nose normal.  Eyes: Conjunctivae and lids are normal. Right eye exhibits no discharge. Left eye exhibits no discharge. No scleral icterus.  Cardiovascular: Normal rate, regular rhythm, normal heart sounds and intact distal pulses. Exam reveals no gallop and no friction rub.  No murmur heard. Pulmonary/Chest: Effort normal and breath sounds normal. No stridor. No respiratory distress. She has no wheezes. She has no rales. She exhibits no tenderness.  Musculoskeletal: Normal range of motion.  Neurological: She is alert and oriented to person, place, and time.  Skin: Skin is warm, dry and intact. Capillary refill takes less than 2 seconds. No rash noted. She is not  diaphoretic. No erythema. No pallor.  Psychiatric: She has a normal mood and affect. Her speech is normal and behavior is normal. Judgment and thought content normal. Cognition and memory are normal.  Nursing note and vitals reviewed.   Results for orders placed or performed in visit on 09/16/17  PapIG, HPV, rfx 16/18  Result Value Ref Range   DIAGNOSIS: Comment    Specimen adequacy: Comment    Clinician Provided ICD10 Comment    Performed by: Comment    Electronically signed by: Comment  PAP Smear Comment .    Note: Comment    Test Methodology Comment    HPV, high-risk CANCELED   HPV, low volume (reflex)  Result Value Ref Range   HPV low volume reflex Negative Negative      Assessment & Plan:   Problem List Items Addressed This Visit      Other   History of HPV infection - Primary    Having paps every 6 months. Following with GYN. Call with any concerns. HPV vaccine given today.      Abnormal Pap smear of cervix    Having paps every 6 months. Following with GYN. Call with any concerns. HPV vaccine given today.       Other Visit Diagnoses    Need for HPV vaccination       #1 given today. Will return in 2 months for #2 and #3.   Relevant Orders   HPV 9-valent vaccine,Recombinat (Completed)   HPV 9-valent vaccine,Recombinat   HPV 9-valent vaccine,Recombinat       Follow up plan: Return 2 month and 6 month nurse visit, for After 10/15 for physical.

## 2017-10-20 NOTE — Assessment & Plan Note (Signed)
Having paps every 6 months. Following with GYN. Call with any concerns. HPV vaccine given today. 

## 2017-10-20 NOTE — Assessment & Plan Note (Signed)
Having paps every 6 months. Following with GYN. Call with any concerns. HPV vaccine given today.

## 2017-10-31 ENCOUNTER — Ambulatory Visit
Admission: RE | Admit: 2017-10-31 | Discharge: 2017-10-31 | Disposition: A | Discharge status: Home / Self Care | Payer: BC Managed Care – PPO | Source: Ambulatory Visit | Attending: Obstetrics and Gynecology | Admitting: Obstetrics and Gynecology

## 2017-10-31 DIAGNOSIS — Z1231 Encounter for screening mammogram for malignant neoplasm of breast: Secondary | ICD-10-CM | POA: Insufficient documentation

## 2017-10-31 DIAGNOSIS — Z1239 Encounter for other screening for malignant neoplasm of breast: Secondary | ICD-10-CM

## 2017-11-03 ENCOUNTER — Encounter (INDEPENDENT_AMBULATORY_CARE_PROVIDER_SITE_OTHER): Payer: Self-pay

## 2017-12-26 ENCOUNTER — Ambulatory Visit (INDEPENDENT_AMBULATORY_CARE_PROVIDER_SITE_OTHER): Payer: BC Managed Care – PPO

## 2017-12-26 ENCOUNTER — Ambulatory Visit: Payer: BC Managed Care – PPO

## 2017-12-26 DIAGNOSIS — Z23 Encounter for immunization: Secondary | ICD-10-CM | POA: Diagnosis not present

## 2018-03-23 ENCOUNTER — Ambulatory Visit (INDEPENDENT_AMBULATORY_CARE_PROVIDER_SITE_OTHER): Payer: BC Managed Care – PPO | Admitting: Family Medicine

## 2018-03-23 ENCOUNTER — Encounter: Payer: Self-pay | Admitting: Family Medicine

## 2018-03-23 VITALS — BP 107/71 | HR 76 | Temp 98.8°F | Ht 61.5 in | Wt 114.9 lb

## 2018-03-23 DIAGNOSIS — Z0000 Encounter for general adult medical examination without abnormal findings: Secondary | ICD-10-CM | POA: Diagnosis not present

## 2018-03-23 DIAGNOSIS — J301 Allergic rhinitis due to pollen: Secondary | ICD-10-CM | POA: Diagnosis not present

## 2018-03-23 DIAGNOSIS — J309 Allergic rhinitis, unspecified: Secondary | ICD-10-CM | POA: Insufficient documentation

## 2018-03-23 DIAGNOSIS — Z23 Encounter for immunization: Secondary | ICD-10-CM | POA: Diagnosis not present

## 2018-03-23 LAB — UA/M W/RFLX CULTURE, ROUTINE
BILIRUBIN UA: NEGATIVE
GLUCOSE, UA: NEGATIVE
KETONES UA: NEGATIVE
Leukocytes, UA: NEGATIVE
Nitrite, UA: NEGATIVE
PROTEIN UA: NEGATIVE
RBC UA: NEGATIVE
SPEC GRAV UA: 1.02 (ref 1.005–1.030)
Urobilinogen, Ur: 0.2 mg/dL (ref 0.2–1.0)
pH, UA: 7 (ref 5.0–7.5)

## 2018-03-23 MED ORDER — FLUTICASONE PROPIONATE 50 MCG/ACT NA SUSP: 2.0000 | Freq: Two times a day (BID) | NASAL | 5 refills | Status: DC

## 2018-03-23 MED ORDER — PREDNISONE 50 MG PO TABS: 50.0000 mg | ORAL_TABLET | Freq: Every day | ORAL | 0 refills | Status: DC

## 2018-03-23 MED ORDER — MONTELUKAST SODIUM 10 MG PO TABS: 10.0000 mg | ORAL_TABLET | Freq: Every day | ORAL | 3 refills | Status: DC

## 2018-03-23 NOTE — Patient Instructions (Addendum)
Influenza (Flu) Vaccine (Inactivated or Recombinant): What You Need to Know 1. Why get vaccinated? Influenza ("flu") is a contagious disease that spreads around the [place] every year, usually between October and May. Flu is caused by influenza viruses, and is spread mainly by coughing, sneezing, and close contact. Anyone can get flu. Flu strikes suddenly and can last several days. Symptoms vary by age, but can include:  fever/chills  sore throat  muscle aches  fatigue  cough  headache  runny or stuffy nose  Flu can also lead to pneumonia and blood infections, and cause diarrhea and seizures in children. If you have a medical condition, such as heart or lung disease, flu can make it worse. Flu is more dangerous for some people. Infants and young children, people 23 years of age and older, pregnant women, and people with certain health conditions or a weakened immune system are at greatest risk. Each year thousands of people in the [place] die from flu, and many more are hospitalized. Flu vaccine can:  keep you from getting flu,  make flu less severe if you do get it, and  keep you from spreading flu to your family and other people. 2. Inactivated and recombinant flu vaccines A dose of flu vaccine is recommended every flu season. Children 6 months through 91 years of age may need two doses during the same flu season. Everyone else needs only one dose each flu season. Some inactivated flu vaccines contain a very small amount of a mercury-based preservative called thimerosal. Studies have not shown thimerosal in vaccines to be harmful, but flu vaccines that do not contain thimerosal are available. There is no live flu virus in flu shots. They cannot cause the flu. There are many flu viruses, and they are always changing. Each year a new flu vaccine is made to protect against three or four viruses that are likely to cause disease in the upcoming flu season. But even when the  vaccine doesn't exactly match these viruses, it may still provide some protection. Flu vaccine cannot prevent:  flu that is caused by a virus not covered by the vaccine, or  illnesses that look like flu but are not.  It takes about 2 weeks for protection to develop after vaccination, and protection lasts through the flu season. 3. Some people should not get this vaccine Tell the person who is giving you the vaccine:  If you have any severe, life-threatening allergies. If you ever had a life-threatening allergic reaction after a dose of flu vaccine, or have a severe allergy to any part of this vaccine, you may be advised not to get vaccinated. Most, but not all, types of flu vaccine contain a small amount of egg protein.  If you ever had Guillain-Barr Syndrome (also called GBS). Some people with a history of GBS should not get this vaccine. This should be discussed with your doctor.  If you are not feeling well. It is usually okay to get flu vaccine when you have a mild illness, but you might be asked to come back when you feel better.  4. Risks of a vaccine reaction With any medicine, including vaccines, there is a chance of reactions. These are usually mild and go away on their own, but serious reactions are also possible. Most people who get a flu shot do not have any problems with it. Minor problems following a flu shot include:  soreness, redness, or swelling where the shot was given  hoarseness  sore,  red or itchy eyes  cough  fever  aches  headache  itching  fatigue  If these problems occur, they usually begin soon after the shot and last 1 or 2 days. More serious problems following a flu shot can include the following:  There may be a small increased risk of Guillain-Barre Syndrome (GBS) after inactivated flu vaccine. This risk has been estimated at 1 or 2 additional cases per million people vaccinated. This is much lower than the risk of severe complications from  flu, which can be prevented by flu vaccine.  Young children who get the flu shot along with pneumococcal vaccine (PCV13) and/or DTaP vaccine at the same time might be slightly more likely to have a seizure caused by fever. Ask your doctor for more information. Tell your doctor if a child who is getting flu vaccine has ever had a seizure.  Problems that could happen after any injected vaccine:  People sometimes faint after a medical procedure, including vaccination. Sitting or lying down for about 15 minutes can help prevent fainting, and injuries caused by a fall. Tell your doctor if you feel dizzy, or have vision changes or ringing in the ears.  Some people get severe pain in the shoulder and have difficulty moving the arm where a shot was given. This happens very rarely.  Any medication can cause a severe allergic reaction. Such reactions from a vaccine are very rare, estimated at about 1 in a million doses, and would happen within a few minutes to a few hours after the vaccination. As with any medicine, there is a very remote chance of a vaccine causing a serious injury or death. The safety of vaccines is always being monitored. For more information, visit: http://www.aguilar.org/ 5. What if there is a serious reaction? What should I look for? Look for anything that concerns you, such as signs of a severe allergic reaction, very high fever, or unusual behavior. Signs of a severe allergic reaction can include hives, swelling of the face and throat, difficulty breathing, a fast heartbeat, dizziness, and weakness. These would start a few minutes to a few hours after the vaccination. What should I do?  If you think it is a severe allergic reaction or other emergency that can't wait, call 9-1-1 and get the person to the nearest hospital. Otherwise, call your doctor.  Reactions should be reported to the Vaccine Adverse Event Reporting System (VAERS). Your doctor should file this report, or you  can do it yourself through the VAERS web site at www.vaers.SamedayNews.es, or by calling 6094730752. ? VAERS does not give medical advice. 6. The National Vaccine Injury Compensation Program The [employer] Vaccine Injury Compensation Program (VICP) is a federal program that was created to compensate people who may have been injured by certain vaccines. Persons who believe they may have been injured by a vaccine can learn about the program and about filing a claim by calling 458-267-6070 or visiting the [place] website at GoldCloset.com.ee. There is a time limit to file a claim for compensation. 7. How can I learn more?  Ask your healthcare provider. He or she can give you the vaccine package insert or suggest other sources of information.  Call your local or state health department.  Contact the Centers for Disease Control and Prevention (CDC): ? Call (540)164-9661 (1-800-CDC-INFO) or ? Visit CDC's website at https://gibson.com/ Vaccine Information Statement, Inactivated Influenza Vaccine (01/07/2014) This information is not intended to replace advice given to you by your health care provider. Make sure  you discuss any questions you have with your health care provider. Document Released: 03/14/2006 Document Revised: 02/08/2016 Document Reviewed: 02/08/2016 Elsevier Interactive Patient Education  2017 [place] Maintenance, Female Adopting a healthy lifestyle and getting preventive care can go a long way to promote health and wellness. Talk with your health care provider about what schedule of regular examinations is right for you. This is a good chance for you to check in with your provider about disease prevention and staying healthy. In between checkups, there are plenty of things you can do on your own. Experts have done a lot of research about which lifestyle changes and preventive measures are most likely to keep you healthy. Ask your health care provider for more  information. Weight and diet Eat a healthy diet  Be sure to include plenty of vegetables, fruits, low-fat dairy products, and lean protein.  Do not eat a lot of foods high in solid fats, added sugars, or salt.  Get regular exercise. This is one of the most important things you can do for your health. ? Most adults should exercise for at least 150 minutes each week. The exercise should increase your heart rate and make you sweat (moderate-intensity exercise). ? Most adults should also do strengthening exercises at least twice a week. This is in addition to the moderate-intensity exercise.  Maintain a healthy weight  Body mass index (BMI) is a measurement that can be used to identify possible weight problems. It estimates body fat based on height and weight. Your health care provider can help determine your BMI and help you achieve or maintain a healthy weight.  For females 8 years of age and older: ? A BMI below 18.5 is considered underweight. ? A BMI of 18.5 to 24.9 is normal. ? A BMI of 25 to 29.9 is considered overweight. ? A BMI of 30 and above is considered obese.  Watch levels of cholesterol and blood lipids  You should start having your blood tested for lipids and cholesterol at 45 years of age, then have this test every 5 years.  You may need to have your cholesterol levels checked more often if: ? Your lipid or cholesterol levels are high. ? You are older than 45 years of age. ? You are at high risk for heart disease.  Cancer screening Lung Cancer  Lung cancer screening is recommended for adults 23-50 years old who are at high risk for lung cancer because of a history of smoking.  A yearly low-dose CT scan of the lungs is recommended for people who: ? Currently smoke. ? Have quit within the past 15 years. ? Have at least a 30-pack-year history of smoking. A pack year is smoking an average of one pack of cigarettes a day for 1 year.  Yearly screening should continue  until it has been 15 years since you quit.  Yearly screening should stop if you develop a health problem that would prevent you from having lung cancer treatment.  Breast Cancer  Practice breast self-awareness. This means understanding how your breasts normally appear and feel.  It also means doing regular breast self-exams. Let your health care provider know about any changes, no matter how small.  If you are in your 20s or 30s, you should have a clinical breast exam (CBE) by a health care provider every 1-3 years as part of a regular health exam.  If you are 31 or older, have a CBE every year. Also consider having a breast X-ray (  mammogram) every year.  If you have a family history of breast cancer, talk to your health care provider about genetic screening.  If you are at high risk for breast cancer, talk to your health care provider about having an MRI and a mammogram every year.  Breast cancer gene (BRCA) assessment is recommended for women who have family members with BRCA-related cancers. BRCA-related cancers include: ? Breast. ? Ovarian. ? Tubal. ? Peritoneal cancers.  Results of the assessment will determine the need for genetic counseling and BRCA1 and BRCA2 testing.  Cervical Cancer Your health care provider may recommend that you be screened regularly for cancer of the pelvic organs (ovaries, uterus, and vagina). This screening involves a pelvic examination, including checking for microscopic changes to the surface of your cervix (Pap test). You may be encouraged to have this screening done every 3 years, beginning at age 71.  For women ages 31-65, health care providers may recommend pelvic exams and Pap testing every 3 years, or they may recommend the Pap and pelvic exam, combined with testing for human papilloma virus (HPV), every 5 years. Some types of HPV increase your risk of cervical cancer. Testing for HPV may also be done on women of any age with unclear Pap test  results.  Other health care providers may not recommend any screening for nonpregnant women who are considered low risk for pelvic cancer and who do not have symptoms. Ask your health care provider if a screening pelvic exam is right for you.  If you have had past treatment for cervical cancer or a condition that could lead to cancer, you need Pap tests and screening for cancer for at least 20 years after your treatment. If Pap tests have been discontinued, your risk factors (such as having a new sexual partner) need to be reassessed to determine if screening should resume. Some women have medical problems that increase the chance of getting cervical cancer. In these cases, your health care provider may recommend more frequent screening and Pap tests.  Colorectal Cancer  This type of cancer can be detected and often prevented.  Routine colorectal cancer screening usually begins at 46 years of age and continues through 45 years of age.  Your health care provider may recommend screening at an earlier age if you have risk factors for colon cancer.  Your health care provider may also recommend using home test kits to check for hidden blood in the stool.  A small camera at the end of a tube can be used to examine your colon directly (sigmoidoscopy or colonoscopy). This is done to check for the earliest forms of colorectal cancer.  Routine screening usually begins at age 8.  Direct examination of the colon should be repeated every 5-10 years through 45 years of age. However, you may need to be screened more often if early forms of precancerous polyps or small growths are found.  Skin Cancer  Check your skin from head to toe regularly.  Tell your health care provider about any new moles or changes in moles, especially if there is a change in a mole's shape or color.  Also tell your health care provider if you have a mole that is larger than the size of a pencil eraser.  Always use sunscreen.  Apply sunscreen liberally and repeatedly throughout the day.  Protect yourself by wearing long sleeves, pants, a wide-brimmed hat, and sunglasses whenever you are outside.  Heart disease, diabetes, and high blood pressure  High blood pressure  causes heart disease and increases the risk of stroke. High blood pressure is more likely to develop in: ? People who have blood pressure in the high end of the normal range (130-139/85-89 mm Hg). ? People who are overweight or obese. ? People who are African American.  If you are 87-72 years of age, have your blood pressure checked every 3-5 years. If you are 45 years of age or older, have your blood pressure checked every year. You should have your blood pressure measured twice-once when you are at a hospital or clinic, and once when you are not at a hospital or clinic. Record the average of the two measurements. To check your blood pressure when you are not at a hospital or clinic, you can use: ? An automated blood pressure machine at a pharmacy. ? A home blood pressure monitor.  If you are between 48 years and 37 years old, ask your health care provider if you should take aspirin to prevent strokes.  Have regular diabetes screenings. This involves taking a blood sample to check your fasting blood sugar level. ? If you are at a normal weight and have a low risk for diabetes, have this test once every three years after 45 years of age. ? If you are overweight and have a high risk for diabetes, consider being tested at a younger age or more often. Preventing infection Hepatitis B  If you have a higher risk for hepatitis B, you should be screened for this virus. You are considered at high risk for hepatitis B if: ? You were born in a country where hepatitis B is common. Ask your health care provider which countries are considered high risk. ? Your parents were born in a high-risk country, and you have not been immunized against hepatitis B (hepatitis B  vaccine). ? You have HIV or AIDS. ? You use needles to inject street drugs. ? You live with someone who has hepatitis B. ? You have had sex with someone who has hepatitis B. ? You get hemodialysis treatment. ? You take certain medicines for conditions, including cancer, organ transplantation, and autoimmune conditions.  Hepatitis C  Blood testing is recommended for: ? Everyone born from [birth year] through 1963. ? Anyone with known risk factors for hepatitis C.  Sexually transmitted infections (STIs)  You should be screened for sexually transmitted infections (STIs) including gonorrhea and chlamydia if: ? You are sexually active and are younger than 45 years of age. ? You are older than 45 years of age and your health care provider tells you that you are at risk for this type of infection. ? Your sexual activity has changed since you were last screened and you are at an increased risk for chlamydia or gonorrhea. Ask your health care provider if you are at risk.  If you do not have HIV, but are at risk, it may be recommended that you take a prescription medicine daily to prevent HIV infection. This is called pre-exposure prophylaxis (PrEP). You are considered at risk if: ? You are sexually active and do not regularly use condoms or know the HIV status of your partner(s). ? You take drugs by injection. ? You are sexually active with a partner who has HIV.  Talk with your health care provider about whether you are at high risk of being infected with HIV. If you choose to begin PrEP, you should first be tested for HIV. You should then be tested every 3 months for as long as  you are taking PrEP. Pregnancy  If you are premenopausal and you may become pregnant, ask your health care provider about preconception counseling.  If you may become pregnant, take 400 to 800 micrograms (mcg) of folic acid every day.  If you want to prevent pregnancy, talk to your health care provider about birth control  (contraception). Osteoporosis and menopause  Osteoporosis is a disease in which the bones lose minerals and strength with aging. This can result in serious bone fractures. Your risk for osteoporosis can be identified using a bone density scan.  If you are 9 years of age or older, or if you are at risk for osteoporosis and fractures, ask your health care provider if you should be screened.  Ask your health care provider whether you should take a calcium or vitamin D supplement to lower your risk for osteoporosis.  Menopause may have certain physical symptoms and risks.  Hormone replacement therapy may reduce some of these symptoms and risks. Talk to your health care provider about whether hormone replacement therapy is right for you. Follow these instructions at home:  Schedule regular health, dental, and eye exams.  Stay current with your immunizations.  Do not use any tobacco products including cigarettes, chewing tobacco, or electronic cigarettes.  If you are pregnant, do not drink alcohol.  If you are breastfeeding, limit how much and how often you drink alcohol.  Limit alcohol intake to no more than 1 drink per day for nonpregnant women. One drink equals 12 ounces of beer, 5 ounces of wine, or 1 ounces of hard liquor.  Do not use street drugs.  Do not share needles.  Ask your health care provider for help if you need support or information about quitting drugs.  Tell your health care provider if you often feel depressed.  Tell your health care provider if you have ever been abused or do not feel safe at home. This information is not intended to replace advice given to you by your health care provider. Make sure you discuss any questions you have with your health care provider. Document Released: 12/03/2010 Document Revised: 10/26/2015 Document Reviewed: 02/21/2015 Elsevier Interactive Patient Education  [employer].

## 2018-03-23 NOTE — Progress Notes (Signed)
BP 107/71   Pulse 76   Temp 98.8 F (37.1 C) (Oral)   Ht 5' 1.5" (1.562 m)   Wt 114 lb 14.4 oz (52.1 kg)   SpO2 96%   BMI 21.36 kg/m    Subjective:    Patient ID: Heidi Rodriguez, female    DOB: 10-13-1970, 47 y.o.   MRN: [id number]  HPI: Heidi Rodriguez is a 47 y.o. female presenting on 03/23/2018 for comprehensive medical examination. Current medical complaints include: Her allergies have really been bothering her for the last week. Has a sore throat and R ear pain. Has been using sudafed with good results.   Menopausal Symptoms: no  Depression Screen done today and results listed below:  Depression screen [facility] 2/9 03/23/2018 10/20/2017  Decreased Interest 0 0  Down, Depressed, Hopeless 0 0  PHQ - 2 Score 0 0  Altered sleeping 0 -  Tired, decreased energy 0 -  Change in appetite 0 -  Feeling bad or failure about yourself  0 -  Trouble concentrating 0 -  Moving slowly or fidgety/restless 0 -  Suicidal thoughts 0 -  PHQ-9 Score 0 -    Past Medical History:  Past Medical History:  Diagnosis Date  . Abnormal cytology   . CIN I (cervical intraepithelial neoplasia I)   . GERD (gastroesophageal reflux disease)   . Herpes genitalis   . PONV (postoperative nausea and vomiting)   . Vulvar intraepithelial neoplasia (VIN) grade 1     Surgical History:  Past Surgical History:  Procedure Laterality Date  . ABDOMINAL HYSTERECTOMY    . COLPOSCOPY N/A 09/22/2015   Procedure: COLPOSCOPY;  Surgeon: Vena Austria, MD;  Location: [facility] ORS;  Service: Gynecology;  Laterality: N/A;  . LESION REMOVAL N/A 09/22/2015   Procedure: EXCISION VAGINAL LESION;  Surgeon: Vena Austria, MD;  Location: [facility] OR[facility];  Service: Gynecology;  Laterality: N/A;    Medications:  Current Outpatient Medications on File Prior to Visit  Medication Sig  . Biotin 10 MG CAPS Take by mouth.  . calcium carbonate 1250 MG capsule Take 1,250 mg by mouth daily.  . Cholecalciferol (VITAMIN D3) 2000 units capsule  Take by mouth.  . Lactobacillus Acid-Pectin (ACIDOPHILUS/PECTIN) CAPS Take by mouth.  . Multiple Vitamin (MULTIVITAMIN) capsule Take 1 capsule by mouth daily.  Marland Kitchen NAFTIN 2 % GEL APPLY DAILY TO AFFECTED AREA(S) PLUS A 0.5 INCH MARGIN TO HEALTHY, SURROUNDING SKIN FOR 2 WEEKS  . ranitidine (ZANTAC) 75 MG tablet Take 75 mg by mouth daily as needed for heartburn.  . spironolactone (ALDACTONE) 50 MG tablet 2(TWO) TABLET(S) ORAL EVERY DAY  . valACYclovir (VALTREX) 500 MG tablet Take 1 tablet (500 mg total) by mouth daily.  . vitamin C (ASCORBIC ACID) 500 MG tablet Take by mouth.   No current facility-administered medications on file prior to visit.     Allergies:  Allergies  Allergen Reactions  . Erythromycin Hives  . Keflex [Cephalexin] Hives  . Penicillin G     Other reaction(s): Unknown    Social History:  Social History   Socioeconomic History  . Marital status: Married    Spouse name: Not on file  . Number of children: Not on file  . Years of education: Not on file  . Highest education level: Not on file  Occupational History  . Not on file  Social Needs  . Financial resource strain: Not on file  . Food insecurity:    Worry: Not on file    Inability: Not on  file  . Transportation needs:    Medical: Not on file    Non-medical: Not on file  Tobacco Use  . Smoking status: Never Smoker  . Smokeless tobacco: Never Used  Substance and Sexual Activity  . Alcohol use: No  . Drug use: No  . Sexual activity: Yes    Birth control/protection: Surgical    Comment: Hysterectomy  Lifestyle  . Physical activity:    Days per week: Not on file    Minutes per session: Not on file  . Stress: Not on file  Relationships  . Social connections:    Talks on phone: Not on file    Gets together: Not on file    Attends religious service: Not on file    Active member of club or organization: Not on file    Attends meetings of clubs or organizations: Not on file    Relationship status:  Not on file  . Intimate partner violence:    Fear of current or ex partner: Not on file    Emotionally abused: Not on file    Physically abused: Not on file    Forced sexual activity: Not on file  Other Topics Concern  . Not on file  Social History Narrative  . Not on file   Social History   Tobacco Use  Smoking Status Never Smoker  Smokeless Tobacco Never Used   Social History   Substance and Sexual Activity  Alcohol Use No    Family History:  Family History  Problem Relation Age of Onset  . Skin cancer Mother 93  . Glaucoma Mother   . Diabetes Maternal Uncle   . Skin cancer Maternal Grandmother   . Leukemia Maternal Grandmother   . CAD Maternal Grandmother   . Glaucoma Maternal Grandmother   . CAD Father   . Hyperlipidemia Father   . Obesity Sister   . Osteoarthritis Sister   . Breast cancer Neg Hx     Past medical history, surgical history, medications, allergies, family history and social history reviewed with patient today and changes made to appropriate areas of the chart.   Review of Systems  Constitutional: Negative.   HENT: Positive for hearing loss and sore throat. Negative for congestion, ear discharge, ear pain, nosebleeds, sinus pain and tinnitus.   Eyes: Negative.   Respiratory: Negative.  Negative for stridor.   Cardiovascular: Negative.   Gastrointestinal: Positive for constipation and heartburn. Negative for abdominal pain, blood in stool, diarrhea, melena, nausea and vomiting.  Genitourinary: Negative.   Musculoskeletal: Negative.   Skin: Negative.   Neurological: Negative.   Endo/Heme/Allergies: Positive for environmental allergies and polydipsia. Does not bruise/bleed easily.  Psychiatric/Behavioral: Negative.     All other ROS negative except what is listed above and in the HPI.      Objective:    BP 107/71   Pulse 76   Temp 98.8 F (37.1 C) (Oral)   Ht 5' 1.5" (1.562 m)   Wt 114 lb 14.4 oz (52.1 kg)   SpO2 96%   BMI 21.36  kg/m   Wt Readings from Last 3 Encounters:  03/23/18 114 lb 14.4 oz (52.1 kg)  10/20/17 115 lb 11.2 oz (52.5 kg)  09/16/17 114 lb (51.7 kg)    Physical Exam  Constitutional: She is oriented to person, place, and time. She appears well-developed and well-nourished. No distress.  HENT:  Head: Normocephalic and atraumatic.  Right Ear: Hearing, tympanic membrane, external ear and ear canal normal.  Left Ear:  Hearing, tympanic membrane, external ear and ear canal normal.  Nose: Nose normal.  Mouth/Throat: Uvula is midline, oropharynx is clear and moist and mucous membranes are normal. No oropharyngeal exudate.  Eyes: Pupils are equal, round, and reactive to light. Conjunctivae, EOM and lids are normal. Right eye exhibits no discharge. Left eye exhibits no discharge. No scleral icterus.  Neck: Normal range of motion. Neck supple. No JVD present. No tracheal deviation present. No thyromegaly present.  Cardiovascular: Normal rate, regular rhythm, normal heart sounds and intact distal pulses. Exam reveals no gallop and no friction rub.  No murmur heard. Pulmonary/Chest: Effort normal and breath sounds normal. No stridor. No respiratory distress. She has no wheezes. She has no rales. She exhibits no tenderness.  Abdominal: Soft. Bowel sounds are normal. She exhibits no distension and no mass. There is no tenderness. There is no rebound and no guarding. No hernia.  Genitourinary:  Genitourinary Comments: Breast and pelvic exams deferred, done at GYN  Musculoskeletal: Normal range of motion. She exhibits no edema, tenderness or deformity.  Lymphadenopathy:    She has no cervical adenopathy.  Neurological: She is alert and oriented to person, place, and time. She displays normal reflexes. No cranial nerve deficit or sensory deficit. She exhibits normal muscle tone. Coordination normal.  Skin: Skin is warm, dry and intact. Capillary refill takes less than 2 seconds. No rash noted. She is not  diaphoretic. No erythema. No pallor.  Psychiatric: She has a normal mood and affect. Her speech is normal and behavior is normal. Judgment and thought content normal. Cognition and memory are normal.  Nursing note and vitals reviewed.   Results for orders placed or performed in visit on 09/16/17  PapIG, HPV, rfx 16/18  Result Value Ref Range   DIAGNOSIS: Comment    Specimen adequacy: Comment    Clinician Provided ICD10 Comment    Performed by: Comment    Electronically signed by: Comment    PAP Smear Comment .    Note: Comment    Test Methodology Comment    HPV, high-risk CANCELED   HPV, low volume (reflex)  Result Value Ref Range   HPV low volume reflex Negative Negative      Assessment & Plan:   Problem List Items Addressed This Visit      Respiratory   Allergic rhinitis    Will treat with prednisone burst and add singulair. Call if not getting better or getting worse.        Other Visit Diagnoses    Routine general medical examination at a health care facility    -  Primary   Vaccines up to date. Screening labs checked today. Pap and mammogram through GYN. Continue diet and exercise. Call with any concerns. Continue to monitor.    Relevant Orders   CBC with Differential/Platelet   Comprehensive metabolic panel   Lipid Panel w/o Chol/HDL Ratio   TSH   UA/M w/rflx Culture, Routine   Need for influenza vaccination       Flu shot given today.   Relevant Orders   Flu Vaccine QUAD 36+ mos IM (Completed)       Follow up plan: Return After 04/22/18 for 3rd HPV vaccine, , for 1 year, physical.   LABORATORY TESTING:  - Pap smear: up to date  IMMUNIZATIONS:   - Tdap: Tetanus vaccination status reviewed: last tetanus booster within 10 years. - Influenza: Administered today - Pneumovax: Not applicable - HPV: Due for last in November  SCREENING: -Mammogram:  Done elsewhere   PATIENT COUNSELING:   Advised to take 1 mg of folate supplement per day if capable of  pregnancy.   Sexuality: Discussed sexually transmitted diseases, partner selection, use of condoms, avoidance of unintended pregnancy  and contraceptive alternatives.   Advised to avoid cigarette smoking.  I discussed with the patient that most people either abstain from alcohol or drink within safe limits (<=14/week and <=4 drinks/occasion for males, <=7/weeks and <= 3 drinks/occasion for females) and that the risk for alcohol disorders and other health effects rises proportionally with the number of drinks per week and how often a drinker exceeds daily limits.  Discussed cessation/primary prevention of drug use and availability of treatment for abuse.   Diet: Encouraged to adjust caloric intake to maintain  or achieve ideal body weight, to reduce intake of dietary saturated fat and total fat, to limit sodium intake by avoiding high sodium foods and not adding table salt, and to maintain adequate dietary potassium and calcium preferably from fresh fruits, vegetables, and low-fat dairy products.    stressed the importance of regular exercise  Injury prevention: Discussed safety belts, safety helmets, smoke detector, smoking near bedding or upholstery.   Dental health: Discussed importance of regular tooth brushing, flossing, and dental visits.    NEXT PREVENTATIVE PHYSICAL DUE IN 1 YEAR. Return After 04/22/18 for 3rd HPV vaccine, , for 1 year, physical.

## 2018-03-23 NOTE — Assessment & Plan Note (Signed)
Will treat with prednisone burst and add singulair. Call if not getting better or getting worse.

## 2018-03-24 LAB — CBC WITH DIFFERENTIAL/PLATELET
BASOS ABS: 0 10*3/uL (ref 0.0–0.2)
Basos: 1 %
EOS (ABSOLUTE): 0.1 10*3/uL (ref 0.0–0.4)
Eos: 2 %
Hematocrit: 41.9 % (ref 34.0–46.6)
Hemoglobin: 14.3 g/dL (ref 11.1–15.9)
Immature Grans (Abs): 0 10*3/uL (ref 0.0–0.1)
Immature Granulocytes: 0 %
LYMPHS ABS: 1.3 10*3/uL (ref 0.7–3.1)
Lymphs: 24 %
MCH: 33.1 pg — ABNORMAL HIGH (ref 26.6–33.0)
MCHC: 34.1 g/dL (ref 31.5–35.7)
MCV: 97 fL (ref 79–97)
MONOCYTES: 7 %
MONOS ABS: 0.4 10*3/uL (ref 0.1–0.9)
Neutrophils Absolute: 3.5 10*3/uL (ref 1.4–7.0)
Neutrophils: 66 %
PLATELETS: 269 10*3/uL (ref 150–450)
RBC: 4.32 x10E6/uL (ref 3.77–5.28)
RDW: 12.6 % (ref 12.3–15.4)
WBC: 5.3 10*3/uL (ref 3.4–10.8)

## 2018-03-24 LAB — LIPID PANEL W/O CHOL/HDL RATIO
CHOLESTEROL TOTAL: 202 mg/dL — AB (ref 100–199)
HDL: 52 mg/dL (ref >39)
LDL CALC: 128 mg/dL — AB (ref 0–99)
TRIGLYCERIDES: 112 mg/dL (ref 0–149)
VLDL Cholesterol Cal: 22 mg/dL (ref 5–40)

## 2018-03-24 LAB — COMPREHENSIVE METABOLIC PANEL
ALK PHOS: 71 IU/L (ref 39–117)
ALT: 22 IU/L (ref 0–32)
AST: 19 IU/L (ref 0–40)
Albumin/Globulin Ratio: 1.6 (ref 1.2–2.2)
Albumin: 3.9 g/dL (ref 3.5–5.5)
BILIRUBIN TOTAL: 1 mg/dL (ref 0.0–1.2)
BUN / CREAT RATIO: 17 (ref 9–23)
BUN: 15 mg/dL (ref 6–24)
CHLORIDE: 103 mmol/L (ref 96–106)
CO2: 25 mmol/L (ref 20–29)
CREATININE: 0.89 mg/dL (ref 0.57–1.00)
Calcium: 9.4 mg/dL (ref 8.7–10.2)
GFR calc Af Amer: 91 mL/min/{1.73_m2} (ref >59)
GFR calc non Af Amer: 79 mL/min/{1.73_m2} (ref >59)
GLUCOSE: 81 mg/dL (ref 65–99)
Globulin, Total: 2.5 g/dL (ref 1.5–4.5)
Potassium: 4.3 mmol/L (ref 3.5–5.2)
Sodium: 143 mmol/L (ref 134–144)
Total Protein: 6.4 g/dL (ref 6.0–8.5)

## 2018-03-24 LAB — TSH: TSH: 0.865 u[IU]/mL (ref 0.450–4.500)

## 2018-03-25 ENCOUNTER — Encounter: Payer: Self-pay | Admitting: Family Medicine

## 2018-03-25 MED ORDER — HYDROCOD POLST-CPM POLST ER 10-8 MG/5ML PO SUER: 5.0000 mL | Freq: Every evening | ORAL | 0 refills | Status: DC | PRN

## 2018-04-09 ENCOUNTER — Encounter: Payer: Self-pay | Admitting: Family Medicine

## 2018-04-13 ENCOUNTER — Ambulatory Visit: Payer: BC Managed Care – PPO | Admitting: Family Medicine

## 2018-04-13 ENCOUNTER — Encounter: Payer: Self-pay | Admitting: Family Medicine

## 2018-04-13 VITALS — BP 112/75 | HR 96 | Temp 98.3°F | Ht 61.5 in | Wt 118.2 lb

## 2018-04-13 DIAGNOSIS — J301 Allergic rhinitis due to pollen: Secondary | ICD-10-CM | POA: Diagnosis not present

## 2018-04-13 MED ORDER — HYDROCOD POLST-CPM POLST ER 10-8 MG/5ML PO SUER: 5.0000 mL | Freq: Every evening | ORAL | 0 refills | Status: DC | PRN

## 2018-04-13 MED ORDER — PREDNISONE 50 MG PO TABS: 50.0000 mg | ORAL_TABLET | Freq: Every day | ORAL | 0 refills | Status: DC

## 2018-04-13 NOTE — Assessment & Plan Note (Signed)
Continue singulair, flonase BID, sinus rinses, sudafed and tussionex prn. Increase zyrtec to twice daily and start second round of prednisone. Will refer back to ENT for eval of restarting allergy shots. Sedation precautions reviewed

## 2018-04-13 NOTE — Progress Notes (Signed)
BP 112/75 (BP Location: Left Arm, Patient Position: Sitting, Cuff Size: Normal)   Pulse 96   Temp 98.3 F (36.8 C)   Ht 5' 1.5" (1.562 m)   Wt 118 lb 4 oz (53.6 kg)   SpO2 96%   BMI 21.98 kg/m    Subjective:    Patient ID: Heidi Rodriguez, female    DOB: [birth year], 47 y.o.   MRN: [id number]  HPI: Heidi Rodriguez is a 47 y.o. female  Chief Complaint  Patient presents with  . URI    X 1 month, seen Dr.Johnson, was given prednisone and allergy medication, went out of the country was fine, as soon as she got back, she started feeling bad again. Patient is taking multiple otc medications   Here today with over a month of significant rhinorrhea, post nasal drainage, and ear pain and pressure. Long hx of severe allergic rhinitis, states she was taking allergy shots for over 10 years up until age 63 through [facility] and was told she would likely need to restart in her 1s. Taking zyrtec and flonase daily, sudafed prn, and using sinus rinses and humidifier. Improved on prednisone temporarily, but sxs returned full force after about a week. Denies fevers, chills, CP, SOB, wheezing.   Relevant past medical, surgical, family and social history reviewed and updated as indicated. Interim medical history since our last visit reviewed. Allergies and medications reviewed and updated.  Review of Systems  Per HPI unless specifically indicated above     Objective:    BP 112/75 (BP Location: Left Arm, Patient Position: Sitting, Cuff Size: Normal)   Pulse 96   Temp 98.3 F (36.8 C)   Ht 5' 1.5" (1.562 m)   Wt 118 lb 4 oz (53.6 kg)   SpO2 96%   BMI 21.98 kg/m   Wt Readings from Last 3 Encounters:  04/13/18 118 lb 4 oz (53.6 kg)  03/23/18 114 lb 14.4 oz (52.1 kg)  10/20/17 115 lb 11.2 oz (52.5 kg)    Physical Exam  Constitutional: She is oriented to person, place, and time. She appears well-developed and well-nourished. No distress.  HENT:  Head: Atraumatic.  Bl/ middle ear effusion,  R > L Nasal mucosa significantly erythematous and edematous, rhinorrhea present Oropharynx erythematous posteriorly  Eyes: Conjunctivae and EOM are normal.  Neck: Normal range of motion. Neck supple.  Cardiovascular: Normal rate, regular rhythm and normal heart sounds.  Pulmonary/Chest: Effort normal and breath sounds normal.  Musculoskeletal: Normal range of motion.  Neurological: She is alert and oriented to person, place, and time.  Skin: Skin is warm and dry.  Psychiatric: She has a normal mood and affect. Her behavior is normal.  Nursing note and vitals reviewed.   Results for orders placed or performed in visit on 03/23/18  CBC with Differential/Platelet  Result Value Ref Range   WBC 5.3 3.4 - 10.8 x10E3/uL   RBC 4.32 3.77 - 5.28 x10E6/uL   Hemoglobin 14.3 11.1 - 15.9 g/dL   Hematocrit 40.9 81.1 - 46.6 %   MCV 97 79 - 97 fL   MCH 33.1 (H) 26.6 - 33.0 pg   MCHC 34.1 31.5 - 35.7 g/dL   RDW 91.4 78.2 - 95.6 %   Platelets 269 150 - 450 x10E3/uL   Neutrophils 66 Not Estab. %   Lymphs 24 Not Estab. %   Monocytes 7 Not Estab. %   Eos 2 Not Estab. %   Basos 1 Not Estab. %   Neutrophils Absolute  3.5 1.4 - 7.0 x10E3/uL   Lymphocytes Absolute 1.3 0.7 - 3.1 x10E3/uL   Monocytes Absolute 0.4 0.1 - 0.9 x10E3/uL   EOS (ABSOLUTE) 0.1 0.0 - 0.4 x10E3/uL   Basophils Absolute 0.0 0.0 - 0.2 x10E3/uL   Immature Granulocytes 0 Not Estab. %   Immature Grans (Abs) 0.0 0.0 - 0.1 x10E3/uL  Comprehensive metabolic panel  Result Value Ref Range   Glucose 81 65 - 99 mg/dL   BUN 15 6 - 24 mg/dL   Creatinine, Ser 6.04 0.57 - 1.00 mg/dL   GFR calc non Af Amer 79 >59 mL/min/1.73   GFR calc Af Amer 91 >59 mL/min/1.73   BUN/Creatinine Ratio 17 9 - 23   Sodium 143 134 - 144 mmol/L   Potassium 4.3 3.5 - 5.2 mmol/L   Chloride 103 96 - 106 mmol/L   CO2 25 20 - 29 mmol/L   Calcium 9.4 8.7 - 10.2 mg/dL   Total Protein 6.4 6.0 - 8.5 g/dL   Albumin 3.9 3.5 - 5.5 g/dL   Globulin, Total 2.5 1.5 - 4.5  g/dL   Albumin/Globulin Ratio 1.6 1.2 - 2.2   Bilirubin Total 1.0 0.0 - 1.2 mg/dL   Alkaline Phosphatase 71 39 - 117 IU/L   AST 19 0 - 40 IU/L   ALT 22 0 - 32 IU/L  Lipid Panel w/o Chol/HDL Ratio  Result Value Ref Range   Cholesterol, Total 202 (H) 100 - 199 mg/dL   Triglycerides 540 0 - 149 mg/dL   HDL 52 >98 mg/dL   VLDL Cholesterol Cal 22 5 - 40 mg/dL   LDL Calculated 119 (H) 0 - 99 mg/dL  TSH  Result Value Ref Range   TSH 0.865 0.450 - 4.500 uIU/mL  UA/M w/rflx Culture, Routine  Result Value Ref Range   Specific Gravity, UA 1.020 1.005 - 1.030   pH, UA 7.0 5.0 - 7.5   Color, UA Yellow Yellow   Appearance Ur Clear Clear   Leukocytes, UA Negative Negative   Protein, UA Negative Negative/Trace   Glucose, UA Negative Negative   Ketones, UA Negative Negative   RBC, UA Negative Negative   Bilirubin, UA Negative Negative   Urobilinogen, Ur 0.2 0.2 - 1.0 mg/dL   Nitrite, UA Negative Negative      Assessment & Plan:   Problem List Items Addressed This Visit      Respiratory   Allergic rhinitis - Primary    Continue singulair, flonase BID, sinus rinses, sudafed and tussionex prn. Increase zyrtec to twice daily and start second round of prednisone. Will refer back to ENT for eval of restarting allergy shots. Sedation precautions reviewed      Relevant Orders   Ambulatory referral to ENT       Follow up plan: Return if symptoms worsen or fail to improve.

## 2018-04-14 NOTE — Patient Instructions (Signed)
Follow up as needed

## 2018-05-08 ENCOUNTER — Ambulatory Visit (INDEPENDENT_AMBULATORY_CARE_PROVIDER_SITE_OTHER): Payer: BC Managed Care – PPO

## 2018-05-08 DIAGNOSIS — Z23 Encounter for immunization: Secondary | ICD-10-CM

## 2018-07-11 ENCOUNTER — Other Ambulatory Visit: Payer: Self-pay | Admitting: Family Medicine

## 2018-08-20 ENCOUNTER — Other Ambulatory Visit: Payer: Self-pay | Admitting: Obstetrics and Gynecology

## 2018-08-20 MED ORDER — ESTRADIOL 0.1 MG/GM VA CREA: TOPICAL_CREAM | VAGINAL | 3 refills | Status: DC

## 2018-08-21 ENCOUNTER — Ambulatory Visit: Payer: BC Managed Care – PPO | Admitting: Obstetrics and Gynecology

## 2018-09-21 ENCOUNTER — Ambulatory Visit: Payer: Self-pay | Admitting: Obstetrics and Gynecology

## 2018-11-12 ENCOUNTER — Other Ambulatory Visit: Payer: Self-pay

## 2018-11-12 ENCOUNTER — Ambulatory Visit (INDEPENDENT_AMBULATORY_CARE_PROVIDER_SITE_OTHER): Payer: Commercial Managed Care - PPO | Admitting: Obstetrics and Gynecology

## 2018-11-12 ENCOUNTER — Encounter: Payer: Self-pay | Admitting: Obstetrics and Gynecology

## 2018-11-12 ENCOUNTER — Other Ambulatory Visit (HOSPITAL_COMMUNITY)
Admission: RE | Admit: 2018-11-12 | Discharge: 2018-11-12 | Disposition: A | Discharge status: Home / Self Care | Payer: Commercial Managed Care - PPO | Source: Ambulatory Visit | Attending: Obstetrics and Gynecology | Admitting: Obstetrics and Gynecology

## 2018-11-12 VITALS — BP 110/72 | HR 75 | Wt 116.0 lb

## 2018-11-12 DIAGNOSIS — Z124 Encounter for screening for malignant neoplasm of cervix: Secondary | ICD-10-CM

## 2018-11-12 DIAGNOSIS — Z01419 Encounter for gynecological examination (general) (routine) without abnormal findings: Secondary | ICD-10-CM

## 2018-11-12 DIAGNOSIS — Z1239 Encounter for other screening for malignant neoplasm of breast: Secondary | ICD-10-CM

## 2018-11-12 DIAGNOSIS — R87619 Unspecified abnormal cytological findings in specimens from cervix uteri: Secondary | ICD-10-CM

## 2018-11-12 NOTE — Progress Notes (Signed)
Gynecology Annual Exam  PCP: Dorcas CarrowJohnson, Megan P, DO  Chief Complaint:  Chief Complaint  Patient presents with  . Gynecologic Exam    History of Present Illness: Patient is a 48 y.o. G1P1 presents for annual exam. The patient has no complaints today.   LMP: No LMP recorded. Patient has had a hysterectomy. Denies vasomotor symptoms   The patient is sexually active. She currently uses status post hysterectomy for contraception. She denies dyspareunia.  The patient does perform self breast exams.  There is no notable family history of breast or ovarian cancer in her family (Aunt in [id number]).  The patient wears seatbelts: yes.   The patient has regular exercise: yes, including weight bearing exercise.  The patient denies current symptoms of depression.    Review of Systems: Review of Systems  Constitutional: Negative for chills and fever.  HENT: Negative for congestion.   Respiratory: Negative for cough and shortness of breath.   Cardiovascular: Negative for chest pain and palpitations.  Gastrointestinal: Negative for abdominal pain, constipation, diarrhea, heartburn, nausea and vomiting.  Genitourinary: Negative for dysuria, frequency and urgency.  Skin: Negative for itching and rash.  Neurological: Negative for dizziness and headaches.  Endo/Heme/Allergies: Negative for polydipsia.  Psychiatric/Behavioral: Negative for depression.    Past Medical History:  Past Medical History:  Diagnosis Date  . Abnormal cytology   . CIN I (cervical intraepithelial neoplasia I)   . GERD (gastroesophageal reflux disease)   . Herpes genitalis   . PONV (postoperative nausea and vomiting)   . Vulvar intraepithelial neoplasia (VIN) grade 1     Past Surgical History:  Past Surgical History:  Procedure Laterality Date  . ABDOMINAL HYSTERECTOMY    . COLPOSCOPY N/A 09/22/2015   Procedure: COLPOSCOPY;  Surgeon: Vena AustriaAndreas Alicianna Litchford, MD;  Location: [facility];  Service: Gynecology;  Laterality: N/A;  .  LESION REMOVAL N/A 09/22/2015   Procedure: EXCISION VAGINAL LESION;  Surgeon: Vena AustriaAndreas Latrece Nitta, MD;  Location: [facility];  Service: Gynecology;  Laterality: N/A;    Gynecologic History:  No LMP recorded. Patient has had a hysterectomy. Contraception: status post hysterectomy Pap:  10/26/03 ASCUS 05/20/05 NIL 06/11/07 NIL 07/11/08 NIL  07/13/09 NIL HPV negative 08/17/09 Supracervical hysterectomy and BSO 07/16/10 NIL 07/20/12 NIL HPV positive 08/03/13 NIL HPV negative 08/08/14 LSIL HPV negative 08/23/14 Colposcopy VAIN I 02/16/15 LSIL HPV negative 08/15/15 LSIL HPV negative 08/24/15 Colposcopy VAIN I 09/22/15 OR excision VAIN I left sidewall, ECC CIN I, 12 O'Clock ectocervix CIN I 09/11/16 ASCUS 10/17/16 Colposcopy negative 09/16/17 NIL HPV negative  Obstetric History: G1P1  Family History:  Family History  Problem Relation Age of Onset  . Skin cancer Mother 130  . Glaucoma Mother   . Diabetes Maternal Uncle   . Skin cancer Maternal Grandmother   . Leukemia Maternal Grandmother   . CAD Maternal Grandmother   . Glaucoma Maternal Grandmother   . CAD Father   . Hyperlipidemia Father   . Obesity Sister   . Osteoarthritis Sister   . Breast cancer Paternal Aunt [id number]    Social History:  Social History   Socioeconomic History  . Marital status: Married    Spouse name: Not on file  . Number of children: Not on file  . Years of education: Not on file  . Highest education level: Not on file  Occupational History  . Not on file  Social Needs  . Financial resource strain: Not on file  . Food insecurity    Worry: Not on file  Inability: Not on file  . Transportation needs    Medical: Not on file    Non-medical: Not on file  Tobacco Use  . Smoking status: Never Smoker  . Smokeless tobacco: Never Used  Substance and Sexual Activity  . Alcohol use: No  . Drug use: No  . Sexual activity: Yes    Birth control/protection: Surgical    Comment: Hysterectomy  Lifestyle  . Physical activity     Days per week: Not on file    Minutes per session: Not on file  . Stress: Not on file  Relationships  . Social Herbalist on phone: Not on file    Gets together: Not on file    Attends religious service: Not on file    Active member of club or organization: Not on file    Attends meetings of clubs or organizations: Not on file    Relationship status: Not on file  . Intimate partner violence    Fear of current or ex partner: Not on file    Emotionally abused: Not on file    Physically abused: Not on file    Forced sexual activity: Not on file  Other Topics Concern  . Not on file  Social History Narrative  . Not on file    Allergies:  Allergies  Allergen Reactions  . Erythromycin Hives  . Keflex [Cephalexin] Hives  . Penicillin G     Other reaction(s): Unknown    Medications: Prior to Admission medications   Medication Sig Start Date End Date Taking? Authorizing Provider  Biotin 10 MG CAPS Take by mouth.   Yes [provider]  calcium carbonate 1250 MG capsule Take 1,250 mg by mouth daily.   Yes [provider]  Cholecalciferol (VITAMIN D3) 2000 units capsule Take by mouth.   Yes [provider]  estradiol (ESTRACE VAGINAL) 0.1 MG/GM vaginal cream 1 gram vaginally twice weekly 08/20/18  Yes Malachy Mood, MD  fluticasone [facility]) 50 MCG/ACT nasal spray Place 2 sprays into both nostrils 2 (two) times daily. 03/23/18  Yes Johnson, Megan P, DO  Lactobacillus Acid-Pectin (ACIDOPHILUS/PECTIN) CAPS Take by mouth.   Yes [provider]  montelukast (SINGULAIR) 10 MG tablet TAKE 1 TABLET BY MOUTH EVERYDAY AT BEDTIME 07/13/18  Yes Johnson, Megan P, DO  Multiple Vitamin (MULTIVITAMIN) capsule Take 1 capsule by mouth daily.   Yes [provider]  NAFTIN 2 % GEL APPLY DAILY TO AFFECTED AREA(S) PLUS A 0.5 INCH MARGIN TO HEALTHY, SURROUNDING SKIN FOR 2 WEEKS 09/10/79  Yes Copland, Alicia B, PA-C  ranitidine (ZANTAC) 75 MG tablet  Take 75 mg by mouth daily as needed for heartburn.   Yes [provider]  spironolactone (ALDACTONE) 50 MG tablet 2(TWO) TABLET(S) ORAL EVERY DAY 07/29/17  Yes [provider]  valACYclovir (VALTREX) 500 MG tablet Take 1 tablet (500 mg total) by mouth daily. 09/16/17  Yes Malachy Mood, MD  vitamin C (ASCORBIC ACID) 500 MG tablet Take by mouth.   Yes [provider]    Physical Exam Vitals: Blood pressure 110/72, pulse 75, weight 116 lb (52.6 kg).  General: NAD HEENT: normocephalic, anicteric Thyroid: no enlargement, no palpable nodules Pulmonary: No increased work of breathing, CTAB Cardiovascular: RRR, distal pulses 2+ Breast: Breast symmetrical, no tenderness, no palpable nodules or masses, no skin or nipple retraction present, no nipple discharge.  No axillary or supraclavicular lymphadenopathy. Abdomen: NABS, soft, non-tender, non-distended.  Umbilicus without lesions.  No hepatomegaly, splenomegaly or  masses palpable. No evidence of hernia  Genitourinary:  External: Normal external female genitalia.  Normal urethral meatus, normal Bartholin's and Skene's glands.    Vagina: Normal vaginal mucosa, no evidence of prolapse.    Cervix: Grossly normal in appearance, no bleeding  Uterus: Non-enlarged, mobile, normal contour.  No CMT  Adnexa: ovaries non-enlarged, no adnexal masses  Rectal: deferred  Lymphatic: no evidence of inguinal lymphadenopathy Extremities: no edema, erythema, or tenderness Neurologic: Grossly intact Psychiatric: mood appropriate, affect full  Female chaperone present for pelvic and breast  portions of the physical exam    Assessment: 48 y.o. G1P1 routine annual exam  Plan: Problem List Items Addressed This Visit      Other   Abnormal Pap smear of cervix    Other Visit Diagnoses    Breast screening    -  Primary   Relevant Orders   MM 3D SCREEN BREAST BILATERAL   Encounter for gynecological examination without abnormal  finding       Screening for malignant neoplasm of cervix       Relevant Orders   Cytology - PAP      1) Mammogram - recommend yearly screening mammogram.  Mammogram Was ordered today   2) STI screening  was notoffered and therefore not obtained  3) ASCCP guidelines and rational discussed.  Patient opts for yearly screening interval  4) Contraception - the patient is currently using  status post hysterectomy.  She is happy with her current form of contraception and plans to continue  5) Colonoscopy -- at age 48  6) Routine healthcare maintenance including cholesterol, diabetes screening discussed managed by PCP  7)Osteoperosis screening - given surgical menopause in 2011 consider early DEXA screening around age 48.  Is taking supplemental vitamin D and calcium  8) Return in about 1 year (around 11/12/2019) for Annual.   Vena AustriaAndreas Gennette Shadix, MD, Merlinda FrederickFACOG [facility], [facility] 11/12/2018, 9:05 AM

## 2018-11-12 NOTE — Patient Instructions (Signed)
[facility] [address] [place] [address]  [facility]  [address] [place] [address]  Phone: [phone]  

## 2018-11-16 LAB — CYTOLOGY - PAP
Diagnosis: NEGATIVE
HPV: NOT DETECTED

## 2018-11-20 ENCOUNTER — Other Ambulatory Visit: Payer: Self-pay

## 2018-11-20 ENCOUNTER — Ambulatory Visit
Admission: RE | Admit: 2018-11-20 | Discharge: 2018-11-20 | Disposition: A | Discharge status: Home / Self Care | Payer: Commercial Managed Care - PPO | Source: Ambulatory Visit | Attending: Obstetrics and Gynecology | Admitting: Obstetrics and Gynecology

## 2018-11-20 DIAGNOSIS — Z1239 Encounter for other screening for malignant neoplasm of breast: Secondary | ICD-10-CM

## 2018-11-20 DIAGNOSIS — Z1231 Encounter for screening mammogram for malignant neoplasm of breast: Secondary | ICD-10-CM | POA: Diagnosis not present

## 2019-03-26 ENCOUNTER — Other Ambulatory Visit: Payer: Self-pay

## 2019-03-26 ENCOUNTER — Encounter: Payer: Self-pay | Admitting: Family Medicine

## 2019-03-26 ENCOUNTER — Ambulatory Visit (INDEPENDENT_AMBULATORY_CARE_PROVIDER_SITE_OTHER): Payer: Commercial Managed Care - PPO | Admitting: Family Medicine

## 2019-03-26 VITALS — BP 93/64 | HR 69 | Temp 98.1°F | Ht 61.5 in | Wt 119.0 lb

## 2019-03-26 DIAGNOSIS — Z0000 Encounter for general adult medical examination without abnormal findings: Secondary | ICD-10-CM | POA: Diagnosis not present

## 2019-03-26 LAB — UA/M W/RFLX CULTURE, ROUTINE
Bilirubin, UA: NEGATIVE
Glucose, UA: NEGATIVE
Ketones, UA: NEGATIVE
Leukocytes,UA: NEGATIVE
Nitrite, UA: NEGATIVE
Protein,UA: NEGATIVE
RBC, UA: NEGATIVE
Specific Gravity, UA: 1.015 (ref 1.005–1.030)
Urobilinogen, Ur: 0.2 mg/dL (ref 0.2–1.0)
pH, UA: 7.5 (ref 5.0–7.5)

## 2019-03-26 MED ORDER — FLUTICASONE PROPIONATE 50 MCG/ACT NA SUSP: 2.0000 | Freq: Two times a day (BID) | NASAL | 12 refills | Status: DC

## 2019-03-26 MED ORDER — MONTELUKAST SODIUM 10 MG PO TABS: ORAL_TABLET | ORAL | 3 refills | Status: DC

## 2019-03-26 NOTE — Progress Notes (Signed)
BP 93/64   Pulse 69   Temp 98.1 F (36.7 C) (Oral)   Ht 5' 1.5" (1.562 m)   Wt 119 lb (54 kg)   SpO2 96%   BMI 22.12 kg/m    Subjective:    Patient ID: Heidi Rodriguez, female    DOB: 1971-02-09, 48 y.o.   MRN: [id number]  HPI: Heidi KlinefelterRebecca Michelin is a 48 y.o. female presenting on 03/26/2019 for comprehensive medical examination. Current medical complaints include:none  Menopausal Symptoms: no  Depression Screen done today and results listed below:  Depression screen [facility] 03/26/2019 03/23/2018 10/20/2017  Decreased Interest 0 0 0  Down, Depressed, Hopeless 0 0 0  PHQ - 2 Score 0 0 0  Altered sleeping 0 0 -  Tired, decreased energy 0 0 -  Change in appetite 0 0 -  Feeling bad or failure about yourself  0 0 -  Trouble concentrating 0 0 -  Moving slowly or fidgety/restless 0 0 -  Suicidal thoughts 0 0 -  PHQ-9 Score 0 0 -  Difficult doing work/chores Not difficult at all - -     Past Medical History:  Past Medical History:  Diagnosis Date  . Abnormal cytology   . CIN I (cervical intraepithelial neoplasia I)   . GERD (gastroesophageal reflux disease)   . Herpes genitalis   . PONV (postoperative nausea and vomiting)   . Vulvar intraepithelial neoplasia (VIN) grade 1     Surgical History:  Past Surgical History:  Procedure Laterality Date  . ABDOMINAL HYSTERECTOMY    . COLPOSCOPY N/A 09/22/2015   Procedure: COLPOSCOPY;  Surgeon: Vena AustriaAndreas Staebler, MD;  Location: [facility] ORS;  Service: Gynecology;  Laterality: N/A;  . LESION REMOVAL N/A 09/22/2015   Procedure: EXCISION VAGINAL LESION;  Surgeon: Vena AustriaAndreas Staebler, MD;  Location: [facility] OR[facility];  Service: Gynecology;  Laterality: N/A;    Medications:  Current Outpatient Medications on File Prior to Visit  Medication Sig  . Biotin 10 MG CAPS Take by mouth.  . calcium carbonate 1250 MG capsule Take 1,250 mg by mouth daily.  . Cholecalciferol (VITAMIN D3) 2000 units capsule Take by mouth.  . estradiol (ESTRACE VAGINAL) 0.1 MG/GM  vaginal cream 1 gram vaginally twice weekly  . Lactobacillus Acid-Pectin (ACIDOPHILUS/PECTIN) CAPS Take by mouth.  . Multiple Vitamin (MULTIVITAMIN) capsule Take 1 capsule by mouth daily.  . Multiple Vitamins-Minerals (ZINC PO) Take by mouth daily.  Marland Kitchen. NAFTIN 2 % GEL APPLY DAILY TO AFFECTED AREA(S) PLUS A 0.5 INCH MARGIN TO HEALTHY, SURROUNDING SKIN FOR 2 WEEKS  . ranitidine (ZANTAC) 75 MG tablet Take 75 mg by mouth daily as needed for heartburn.  . spironolactone (ALDACTONE) 50 MG tablet 2(TWO) TABLET(S) ORAL EVERY DAY  . TURMERIC PO Take by mouth daily.  . valACYclovir (VALTREX) 500 MG tablet Take 1 tablet (500 mg total) by mouth daily.  . vitamin C (ASCORBIC ACID) 500 MG tablet Take by mouth.   No current facility-administered medications on file prior to visit.     Allergies:  Allergies  Allergen Reactions  . Erythromycin Hives  . Keflex [Cephalexin] Hives  . Penicillin G     Other reaction(s): Unknown    Social History:  Social History   Socioeconomic History  . Marital status: Married    Spouse name: Not on file  . Number of children: Not on file  . Years of education: Not on file  . Highest education level: Not on file  Occupational History  . Not on file  Social  Needs  . Financial resource strain: Not on file  . Food insecurity    Worry: Not on file    Inability: Not on file  . Transportation needs    Medical: Not on file    Non-medical: Not on file  Tobacco Use  . Smoking status: Never Smoker  . Smokeless tobacco: Never Used  Substance and Sexual Activity  . Alcohol use: No  . Drug use: No  . Sexual activity: Yes    Birth control/protection: Surgical    Comment: Hysterectomy  Lifestyle  . Physical activity    Days per week: Not on file    Minutes per session: Not on file  . Stress: Not on file  Relationships  . Social Musician on phone: Not on file    Gets together: Not on file    Attends religious service: Not on file    Active member  of club or organization: Not on file    Attends meetings of clubs or organizations: Not on file    Relationship status: Not on file  . Intimate partner violence    Fear of current or ex partner: Not on file    Emotionally abused: Not on file    Physically abused: Not on file    Forced sexual activity: Not on file  Other Topics Concern  . Not on file  Social History Narrative  . Not on file   Social History   Tobacco Use  Smoking Status Never Smoker  Smokeless Tobacco Never Used   Social History   Substance and Sexual Activity  Alcohol Use No    Family History:  Family History  Problem Relation Age of Onset  . Skin cancer Mother 2  . Glaucoma Mother   . Diabetes Maternal Uncle   . Skin cancer Maternal Grandmother   . Leukemia Maternal Grandmother   . CAD Maternal Grandmother   . Glaucoma Maternal Grandmother   . CAD Father   . Hyperlipidemia Father   . Obesity Sister   . Osteoarthritis Sister   . Breast cancer Paternal Aunt 61    Past medical history, surgical history, medications, allergies, family history and social history reviewed with patient today and changes made to appropriate areas of the chart.   Review of Systems  Constitutional: Negative.   HENT: Negative.   Eyes: Negative.   Respiratory: Negative.   Cardiovascular: Negative.   Gastrointestinal: Positive for heartburn (occasionally in the middle of the night). Negative for abdominal pain, blood in stool, constipation, diarrhea, melena, nausea and vomiting.  Genitourinary: Negative.   Musculoskeletal: Negative.   Skin: Negative.   Neurological: Negative.   Endo/Heme/Allergies: Negative.   Psychiatric/Behavioral: Negative.     All other ROS negative except what is listed above and in the HPI.      Objective:    BP 93/64   Pulse 69   Temp 98.1 F (36.7 C) (Oral)   Ht 5' 1.5" (1.562 m)   Wt 119 lb (54 kg)   SpO2 96%   BMI 22.12 kg/m   Wt Readings from Last 3 Encounters:  03/26/19 119  lb (54 kg)  11/12/18 116 lb (52.6 kg)  04/13/18 118 lb 4 oz (53.6 kg)    Physical Exam Vitals signs and nursing note reviewed.  Constitutional:      General: She is not in acute distress.    Appearance: Normal appearance. She is not ill-appearing, toxic-appearing or diaphoretic.  HENT:     Head: Normocephalic  and atraumatic.     Right Ear: Tympanic membrane, ear canal and external ear normal. There is no impacted cerumen.     Left Ear: Tympanic membrane, ear canal and external ear normal. There is no impacted cerumen.     Nose: Nose normal. No congestion or rhinorrhea.     Mouth/Throat:     Mouth: Mucous membranes are moist.     Pharynx: Oropharynx is clear. No oropharyngeal exudate or posterior oropharyngeal erythema.  Eyes:     General: No scleral icterus.       Right eye: No discharge.        Left eye: No discharge.     Extraocular Movements: Extraocular movements intact.     Conjunctiva/sclera: Conjunctivae normal.     Pupils: Pupils are equal, round, and reactive to light.  Neck:     Musculoskeletal: Normal range of motion and neck supple. No neck rigidity or muscular tenderness.     Vascular: No carotid bruit.  Cardiovascular:     Rate and Rhythm: Normal rate and regular rhythm.     Pulses: Normal pulses.     Heart sounds: No murmur. No friction rub. No gallop.   Pulmonary:     Effort: Pulmonary effort is normal. No respiratory distress.     Breath sounds: Normal breath sounds. No stridor. No wheezing, rhonchi or rales.  Chest:     Chest wall: No tenderness.  Abdominal:     General: Abdomen is flat. Bowel sounds are normal. There is no distension.     Palpations: Abdomen is soft. There is no mass.     Tenderness: There is no abdominal tenderness. There is no right CVA tenderness, left CVA tenderness, guarding or rebound.     Hernia: No hernia is present.  Genitourinary:    Comments: Breast and pelvic exams deferred with shared decision making Musculoskeletal:         General: No swelling, tenderness, deformity or signs of injury.     Right lower leg: No edema.     Left lower leg: No edema.  Lymphadenopathy:     Cervical: No cervical adenopathy.  Skin:    General: Skin is warm and dry.     Capillary Refill: Capillary refill takes less than 2 seconds.     Coloration: Skin is not jaundiced or pale.     Findings: No bruising, erythema, lesion or rash.  Neurological:     General: No focal deficit present.     Mental Status: She is alert and oriented to person, place, and time. Mental status is at baseline.     Cranial Nerves: No cranial nerve deficit.     Sensory: No sensory deficit.     Motor: No weakness.     Coordination: Coordination normal.     Gait: Gait normal.     Deep Tendon Reflexes: Reflexes normal.  Psychiatric:        Mood and Affect: Mood normal.        Behavior: Behavior normal.        Thought Content: Thought content normal.        Judgment: Judgment normal.     Results for orders placed or performed in visit on 11/12/18  Cytology - PAP  Result Value Ref Range   Adequacy      Satisfactory for evaluation  endocervical/transformation zone component PRESENT.   Diagnosis      NEGATIVE FOR INTRAEPITHELIAL LESIONS OR MALIGNANCY.   HPV NOT DETECTED    Material Submitted  CervicoVaginal Pap [ThinPrep Imaged]       Assessment & Plan:   Problem List Items Addressed This Visit    None    Visit Diagnoses    Routine general medical examination at a health care facility    -  Primary   Vaccines up to date. Screening labs checked today. Mammogram and pap up to date. Continue diet and exercise. Call with any concerns.    Relevant Orders   CBC with Differential/Platelet   Comprehensive metabolic panel   Lipid Panel w/o Chol/HDL Ratio   TSH   UA/M w/rflx Culture, Routine       Follow up plan: Return in about 1 year (around 03/25/2020).   LABORATORY TESTING:  - Pap smear: up to date  IMMUNIZATIONS:   - Tdap: Tetanus  vaccination status reviewed: last tetanus booster within 10 years. - Influenza: Administered today - Pneumovax: Not applicable  SCREENING: -Mammogram: Up to date   PATIENT COUNSELING:   Advised to take 1 mg of folate supplement per day if capable of pregnancy.   Sexuality: Discussed sexually transmitted diseases, partner selection, use of condoms, avoidance of unintended pregnancy  and contraceptive alternatives.   Advised to avoid cigarette smoking.  I discussed with the patient that most people either abstain from alcohol or drink within safe limits (<=14/week and <=4 drinks/occasion for males, <=7/weeks and <= 3 drinks/occasion for females) and that the risk for alcohol disorders and other health effects rises proportionally with the number of drinks per week and how often a drinker exceeds daily limits.  Discussed cessation/primary prevention of drug use and availability of treatment for abuse.   Diet: Encouraged to adjust caloric intake to maintain  or achieve ideal body weight, to reduce intake of dietary saturated fat and total fat, to limit sodium intake by avoiding high sodium foods and not adding table salt, and to maintain adequate dietary potassium and calcium preferably from fresh fruits, vegetables, and low-fat dairy products.    stressed the importance of regular exercise  Injury prevention: Discussed safety belts, safety helmets, smoke detector, smoking near bedding or upholstery.   Dental health: Discussed importance of regular tooth brushing, flossing, and dental visits.    NEXT PREVENTATIVE PHYSICAL DUE IN 1 YEAR. Return in about 1 year (around 03/25/2020).

## 2019-03-26 NOTE — Patient Instructions (Signed)
Health Maintenance, Female Adopting a healthy lifestyle and getting preventive care are important in promoting health and wellness. Ask your health care provider about:  The right schedule for you to have regular tests and exams.  Things you can do on your own to prevent diseases and keep yourself healthy. What should I know about diet, weight, and exercise? Eat a healthy diet   Eat a diet that includes plenty of vegetables, fruits, low-fat dairy products, and lean protein.  Do not eat a lot of foods that are high in solid fats, added sugars, or sodium. Maintain a healthy weight Body mass index (BMI) is used to identify weight problems. It estimates body fat based on height and weight. Your health care provider can help determine your BMI and help you achieve or maintain a healthy weight. Get regular exercise Get regular exercise. This is one of the most important things you can do for your health. Most adults should:  Exercise for at least 150 minutes each week. The exercise should increase your heart rate and make you sweat (moderate-intensity exercise).  Do strengthening exercises at least twice a week. This is in addition to the moderate-intensity exercise.  Spend less time sitting. Even light physical activity can be beneficial. Watch cholesterol and blood lipids Have your blood tested for lipids and cholesterol at 46 years of age, then have this test every 5 years. Have your cholesterol levels checked more often if:  Your lipid or cholesterol levels are high.  You are older than 46 years of age.  You are at high risk for heart disease. What should I know about cancer screening? Depending on your health history and family history, you may need to have cancer screening at various ages. This may include screening for:  Breast cancer.  Cervical cancer.  Colorectal cancer.  Skin cancer.  Lung cancer. What should I know about heart disease, diabetes, and high blood  pressure? Blood pressure and heart disease  High blood pressure causes heart disease and increases the risk of stroke. This is more likely to develop in people who have high blood pressure readings, are of African descent, or are overweight.  Have your blood pressure checked: ? Every 3-5 years if you are 18-39 years of age. ? Every year if you are 40 years old or older. Diabetes Have regular diabetes screenings. This checks your fasting blood sugar level. Have the screening done:  Once every three years after age 40 if you are at a normal weight and have a low risk for diabetes.  More often and at a younger age if you are overweight or have a high risk for diabetes. What should I know about preventing infection? Hepatitis B If you have a higher risk for hepatitis B, you should be screened for this virus. Talk with your health care provider to find out if you are at risk for hepatitis B infection. Hepatitis C Testing is recommended for:  Everyone born from 1945 through 1965.  Anyone with known risk factors for hepatitis C. Sexually transmitted infections (STIs)  Get screened for STIs, including gonorrhea and chlamydia, if: ? You are sexually active and are younger than 46 years of age. ? You are older than 46 years of age and your health care provider tells you that you are at risk for this type of infection. ? Your sexual activity has changed since you were last screened, and you are at increased risk for chlamydia or gonorrhea. Ask your health care provider if   you are at risk.  Ask your health care provider about whether you are at high risk for HIV. Your health care provider may recommend a prescription medicine to help prevent HIV infection. If you choose to take medicine to prevent HIV, you should first get tested for HIV. You should then be tested every 3 months for as long as you are taking the medicine. Pregnancy  If you are about to stop having your period (premenopausal) and  you may become pregnant, seek counseling before you get pregnant.  Take 400 to 800 micrograms (mcg) of folic acid every day if you become pregnant.  Ask for birth control (contraception) if you want to prevent pregnancy. Osteoporosis and menopause Osteoporosis is a disease in which the bones lose minerals and strength with aging. This can result in bone fractures. If you are 65 years old or older, or if you are at risk for osteoporosis and fractures, ask your health care provider if you should:  Be screened for bone loss.  Take a calcium or vitamin D supplement to lower your risk of fractures.  Be given hormone replacement therapy (HRT) to treat symptoms of menopause. Follow these instructions at home: Lifestyle  Do not use any products that contain nicotine or tobacco, such as cigarettes, e-cigarettes, and chewing tobacco. If you need help quitting, ask your health care provider.  Do not use street drugs.  Do not share needles.  Ask your health care provider for help if you need support or information about quitting drugs. Alcohol use  Do not drink alcohol if: ? Your health care provider tells you not to drink. ? You are pregnant, may be pregnant, or are planning to become pregnant.  If you drink alcohol: ? Limit how much you use to 0-1 drink a day. ? Limit intake if you are breastfeeding.  Be aware of how much alcohol is in your drink. In the [place] one drink equals one 12 oz bottle of beer (355 mL), one 5 oz glass of wine (148 mL), or one 1 oz glass of hard liquor (44 mL). General instructions  Schedule regular health, dental, and eye exams.  Stay current with your vaccines.  Tell your health care provider if: ? You often feel depressed. ? You have ever been abused or do not feel safe at home. Summary  Adopting a healthy lifestyle and getting preventive care are important in promoting health and wellness.  Follow your health care provider's instructions about healthy  diet, exercising, and getting tested or screened for diseases.  Follow your health care provider's instructions on monitoring your cholesterol and blood pressure. This information is not intended to replace advice given to you by your health care provider. Make sure you discuss any questions you have with your health care provider. Document Released: 12/03/2010 Document Revised: 05/13/2018 Document Reviewed: 05/13/2018 Elsevier Patient Education  2020 [employer]  

## 2019-03-27 LAB — COMPREHENSIVE METABOLIC PANEL
ALT: 23 IU/L (ref 0–32)
AST: 22 IU/L (ref 0–40)
Albumin/Globulin Ratio: 1.8 (ref 1.2–2.2)
Albumin: 4.2 g/dL (ref 3.8–4.8)
Alkaline Phosphatase: 70 IU/L (ref 39–117)
BUN/Creatinine Ratio: 19 (ref 9–23)
BUN: 18 mg/dL (ref 6–24)
Bilirubin Total: 1 mg/dL (ref 0.0–1.2)
CO2: 25 mmol/L (ref 20–29)
Calcium: 9.8 mg/dL (ref 8.7–10.2)
Chloride: 102 mmol/L (ref 96–106)
Creatinine, Ser: 0.93 mg/dL (ref 0.57–1.00)
GFR calc Af Amer: 86 mL/min/{1.73_m2} (ref >59)
GFR calc non Af Amer: 74 mL/min/{1.73_m2} (ref >59)
Globulin, Total: 2.4 g/dL (ref 1.5–4.5)
Glucose: 85 mg/dL (ref 65–99)
Potassium: 4.1 mmol/L (ref 3.5–5.2)
Sodium: 140 mmol/L (ref 134–144)
Total Protein: 6.6 g/dL (ref 6.0–8.5)

## 2019-03-27 LAB — CBC WITH DIFFERENTIAL/PLATELET
Basophils Absolute: 0 10*3/uL (ref 0.0–0.2)
Basos: 1 %
EOS (ABSOLUTE): 0.2 10*3/uL (ref 0.0–0.4)
Eos: 3 %
Hematocrit: 41.7 % (ref 34.0–46.6)
Hemoglobin: 14.3 g/dL (ref 11.1–15.9)
Immature Grans (Abs): 0 10*3/uL (ref 0.0–0.1)
Immature Granulocytes: 0 %
Lymphocytes Absolute: 1.4 10*3/uL (ref 0.7–3.1)
Lymphs: 21 %
MCH: 32.9 pg (ref 26.6–33.0)
MCHC: 34.3 g/dL (ref 31.5–35.7)
MCV: 96 fL (ref 79–97)
Monocytes Absolute: 0.4 10*3/uL (ref 0.1–0.9)
Monocytes: 6 %
Neutrophils Absolute: 4.7 10*3/uL (ref 1.4–7.0)
Neutrophils: 69 %
Platelets: 244 10*3/uL (ref 150–450)
RBC: 4.34 x10E6/uL (ref 3.77–5.28)
RDW: 12.5 % (ref 11.7–15.4)
WBC: 6.7 10*3/uL (ref 3.4–10.8)

## 2019-03-27 LAB — LIPID PANEL W/O CHOL/HDL RATIO
Cholesterol, Total: 225 mg/dL — ABNORMAL HIGH (ref 100–199)
HDL: 59 mg/dL (ref >39)
LDL Chol Calc (NIH): 150 mg/dL — ABNORMAL HIGH (ref 0–99)
Triglycerides: 90 mg/dL (ref 0–149)
VLDL Cholesterol Cal: 16 mg/dL (ref 5–40)

## 2019-03-27 LAB — TSH: TSH: 0.991 u[IU]/mL (ref 0.450–4.500)

## 2019-03-29 ENCOUNTER — Telehealth: Payer: Self-pay

## 2019-03-29 MED ORDER — AZELASTINE HCL 0.1 % NA SOLN: 2.0000 | Freq: Two times a day (BID) | NASAL | 12 refills | Status: DC

## 2019-03-29 NOTE — Addendum Note (Signed)
Addended by: Valerie Roys on: 03/29/2019 11:50 AM   Modules accepted: Orders

## 2019-03-29 NOTE — Telephone Encounter (Signed)
Flonase is not covered, Azelastine and olopatadine is covered alternatives.

## 2019-06-14 ENCOUNTER — Other Ambulatory Visit: Payer: Self-pay | Admitting: Obstetrics and Gynecology

## 2019-10-27 ENCOUNTER — Other Ambulatory Visit: Payer: Self-pay | Admitting: Family Medicine

## 2019-10-27 DIAGNOSIS — Z1231 Encounter for screening mammogram for malignant neoplasm of breast: Secondary | ICD-10-CM

## 2019-11-15 ENCOUNTER — Encounter: Payer: Self-pay | Admitting: Physical Therapy

## 2019-11-15 ENCOUNTER — Ambulatory Visit: Payer: Commercial Managed Care - PPO | Attending: Obstetrics and Gynecology | Admitting: Physical Therapy

## 2019-11-15 ENCOUNTER — Encounter: Payer: Self-pay | Admitting: Obstetrics and Gynecology

## 2019-11-15 ENCOUNTER — Other Ambulatory Visit (HOSPITAL_COMMUNITY)
Admission: RE | Admit: 2019-11-15 | Discharge: 2019-11-15 | Disposition: A | Discharge status: Home / Self Care | Payer: Commercial Managed Care - PPO | Source: Ambulatory Visit | Attending: Obstetrics and Gynecology | Admitting: Obstetrics and Gynecology

## 2019-11-15 ENCOUNTER — Ambulatory Visit (INDEPENDENT_AMBULATORY_CARE_PROVIDER_SITE_OTHER): Payer: Commercial Managed Care - PPO | Admitting: Obstetrics and Gynecology

## 2019-11-15 ENCOUNTER — Other Ambulatory Visit: Payer: Self-pay

## 2019-11-15 VITALS — BP 110/66 | HR 73 | Ht 62.0 in | Wt 122.0 lb

## 2019-11-15 DIAGNOSIS — R278 Other lack of coordination: Secondary | ICD-10-CM | POA: Insufficient documentation

## 2019-11-15 DIAGNOSIS — Z124 Encounter for screening for malignant neoplasm of cervix: Secondary | ICD-10-CM

## 2019-11-15 DIAGNOSIS — Z1239 Encounter for other screening for malignant neoplasm of breast: Secondary | ICD-10-CM

## 2019-11-15 DIAGNOSIS — N393 Stress incontinence (female) (male): Secondary | ICD-10-CM

## 2019-11-15 DIAGNOSIS — R293 Abnormal posture: Secondary | ICD-10-CM | POA: Diagnosis present

## 2019-11-15 DIAGNOSIS — Z01419 Encounter for gynecological examination (general) (routine) without abnormal findings: Secondary | ICD-10-CM | POA: Diagnosis not present

## 2019-11-15 DIAGNOSIS — M6281 Muscle weakness (generalized): Secondary | ICD-10-CM

## 2019-11-15 NOTE — Therapy (Signed)
[facility] [address] [place], [place], [address] Phone: [phone]   Fax:  [phone]  Physical Therapy Evaluation  Patient Details  Name: Tabia Landowski MRN: [id number] Date of Birth: Mar 01, 1971 Referring Provider (PT): Vena Austria   Encounter Date: 11/15/2019   PT End of Session - 11/15/19 1256    Visit Number 1    Number of Visits 12    Date for PT Re-Evaluation 02/07/20    PT Start Time 1300    PT Stop Time 1350    PT Time Calculation (min) 50 min    Activity Tolerance Patient tolerated treatment well    Behavior During Therapy [facility] for tasks assessed/performed           Past Medical History:  Diagnosis Date  . Abnormal cytology   . CIN I (cervical intraepithelial neoplasia I)   . GERD (gastroesophageal reflux disease)   . Herpes genitalis   . PONV (postoperative nausea and vomiting)   . Vulvar intraepithelial neoplasia (VIN) grade 1     Past Surgical History:  Procedure Laterality Date  . ABDOMINAL HYSTERECTOMY    . COLPOSCOPY N/A 09/22/2015   Procedure: COLPOSCOPY;  Surgeon: Vena Austria, MD;  Location: [facility] ORS;  Service: Gynecology;  Laterality: N/A;  . LESION REMOVAL N/A 09/22/2015   Procedure: EXCISION VAGINAL LESION;  Surgeon: Vena Austria, MD;  Location: [facility] ORS;  Service: Gynecology;  Laterality: N/A;    There were no vitals filed for this visit.        [facility] PT Assessment - 11/15/19 0001      Assessment   Medical Diagnosis SUI    Referring Provider (PT) Vena Austria    Onset Date/Surgical Date 11/15/11    Prior Therapy None for this dx            PELVIC HEALTH PHYSICAL THERAPY EVALUATION  SCREENING Red Flags: None Have you had any night sweats? Unexplained weight loss? Saddle anesthesia? Unexplained changes in bowel or bladder habits?  Precautions: None  SUBJECTIVE  Chief Complaint: Patient notes SUI with limited ability to delay urge to urinate. Patient  also notes bouts of constipation with straining and generally slower motility. Patient takes "super colon cleanse" regularly to encourage BMs. Patient has good relief with this. Patient sees chiropractor monthly for R hip/piriformis with prescribed stretches as well with some relief.   Pertinent History:  Falls Positive for frequent falls attributed to clumsiness and tight piriformis mm.  Scoliosis Negative. Pulmonary disease/dysfunction Negative. Surgical history: Positive for abdominal hysterectomy.   Obstetrical History: G1P1 Deliveries: vaginal Tearing/Episiotomy: episiotomy Birthing position: back  Gynecological History: Hysterectomy: Yes Abdominal Pain with exam: No   Urinary History: Incontinence: Positive. Onset: 7-8 years ago with increased incidence recently Triggers: urge; coughing/laughing/sneezing (50% frequency). Amount: Min/Mod Fluid Intake: 140 oz H20, 2 glasses decaf tea. Nocturia: 1-2x/night Frequency of urination: 5-8? x/day/ every 60-90 min Pain with urination: Negative Difficulty initiating urination: Negative Frequent UTI: Negative.   Gastrointestinal History: [place] Stool Chart: Type 1 without Super Colon Cleanse; Type 5 with Super Colon Cleanse Frequency of BMs: 3x/week Pain with defecation: Positive for occasional.  Straining with defecation: Positive Incontinence: Negative.   Sexual activity/pain: Pain with intercourse: Negative.   Initial penetration: No  Deep thrustingNo   Location of pain: R hip/piriformis Current pain:  0/10  Max pain:  7-8/10 Least pain:  0/10 Pain quality: pain quality: dull and locked Radiating pain: No    Patient assessment of present state: "I  think I was just freaked out by everything that just happened to my mom and I want to prevent needing surgery."  Current activities:  Bootcamp workouts 3x/week; hike/walk; bicycle riding  Patient Goals:  Prevent leakage and prolapse    OBJECTIVE  Mental  Status Patient is oriented to person, place and time.  Recent memory is intact.  Remote memory is intact.  Attention span and concentration are intact.  Expressive speech is intact.  Patient's fund of knowledge is within normal limits for educational level.  POSTURE/OBSERVATIONS:  Lumbar lordosis: WNL Iliac crest height: equal bilaterally Lumbar lateral shift: negative Pelvic obliquity: negative Leg length discrepancy: negative Increased paraspinal tone on L throughout lumbar.  GAIT: WNL; no gross abnormalities to gait. Mild IR of RLE.   RANGE OF MOTION:    LEFT RIGHT  Lumbar forward flexion (65):  WNL    Lumbar extension (30): WNL    Lumbar lateral flexion (25):  WNL 80% *  Thoracic and Lumbar rotation (30 degrees):    WNL 80%*  Hip Flexion (0-125):   [facility] WFL  Hip IR (0-45):  [facility] WFL  Hip ER (0-45):  [facility] WFL  Hip Abduction (0-40):  [facility] WFL  Hip extension (0-15):  [facility] WFL    SENSATION: Grossly intact to light touch bilateral LEs as determined by testing dermatomes L2-S2 Proprioception and hot/cold testing deferred on this date  STRENGTH: MMT   RLE LLE  Hip Flexion 5 5  Hip Extension 5 5  Hip Abduction  5 5  Hip Adduction  5 5  Hip ER  5 5  Hip IR  5 5  Knee Extension 5 5  Knee Flexion 5 5  Dorsiflexion  5 5  Plantarflexion (seated) 5 5   ABDOMINAL:  Palpation: deferred 2/2 to time Diastasis: deferred 2/2 to time Rib flare: none apparent  SPECIAL TESTS: Stork/March (SP 93): R: Positive L: Negative  PHYSICAL PERFORMANCE MEASURES: STS: WNL  EXTERNAL PELVIC EXAM: deferred 2/2 to time constraints Palpation: Breath coordination: Cued Lengthen: Cued Contraction: Cough:  INTERNAL VAGINAL EXAM: deferred 2/2 to time constraints Introitus Appears:  Skin integrity:  Scar mobility: Strength (PERF):  Symmetry: Palpation: Prolapse:   INTERNAL RECTAL EXAM: deferred 2/2 to time constraints Strength (PERF): Symmetry: Palpation: Prolapse:   OUTCOME  MEASURES: FOTO (Urinary 54 Constipation 55)   ASSESSMENT Patient is a 49 year old presenting to clinic with chief complaints of urinary incontinence and R hip pain/discomfort. Upon examination, patient demonstrates deficits in posture, spinal mobility, PFM coordination, PFM strength, urinary continence as evidenced by 50% frequency of urinary incontinence with stressor present, increased L lumbar paraspinal tone, L sided pain with R lumbar lateral flexion and R lumbar rotation. Patient's responses on FOTO outcome measures (Urinary 54, Constipation 55) indicate moderate functional limitations/disability/distress. Patient's progress may be limited due to persistence of symptoms; however, patient's motivation is advantageous. Patient was able to achieve basic understanding of typical bladder function and PFM function during today's evaluation and responded positively to educational interventions. Patient will benefit from continued skilled therapeutic intervention to address deficits in posture, spinal mobility, PFM coordination, PFM strength, urinary continence in order to increase function, and improve overall QOL.  EDUCATION Patient educated on prognosis, POC, and provided with HEP including: bladder diary and diaphragmatic breathing. Patient articulated understanding and returned demonstration. Patient will benefit from further education in order to maximize compliance and understanding for long-term therapeutic gains.  TREATMENT  Neuromuscular Re-education: Patient educated on primary functions of the pelvic floor including:  posture/balance, sexual pleasure, storage and elimination of waste from the body, abdominal cavity closure, and breath coordination.  Objective measurements completed on examination: See above findings.       PT Long Term Goals - 11/15/19 1528      PT LONG TERM GOAL #1   Title Patient will demonstrate independence with HEP in order to maximize therapeutic gains and  improve carryover from physical therapy sessions to ADLs in the home and community.    Baseline IE: not demonstrated    Time 12    Period Weeks    Status New    Target Date 02/07/20      PT LONG TERM GOAL #2   Title Patient will demonstrate independent and coordinated diaphragmatic breathing in supine with a 1:2 breathing pattern for improved down-regulation of the nervous system and improved management of intra-abdominal pressures in order to increase function at home and in the community.    Baseline IE: not demonstrated    Time 12    Period Weeks    Status New    Target Date 02/07/20      PT LONG TERM GOAL #3   Title Patient will demonstrate improved function as evidenced by a score of Urinary 66 on FOTO measure for full participation in activities at home and in the community.    Baseline IE: 54    Time 12    Period Weeks    Status New    Target Date 02/07/20      PT LONG TERM GOAL #4   Title Patient will demonstrate improved function as evidenced by a score of Bowel Constipation 64 on FOTO measure for full participation in activities at home and in the community.    Baseline IE: 55    Time 12    Period Weeks    Status New    Target Date 02/07/20                  Plan - 11/15/19 1257    Clinical Impression Statement Patient is a 49 year old presenting to clinic with chief complaints of urinary incontinence and R hip pain/discomfort. Upon examination, patient demonstrates deficits in posture, spinal mobility, PFM coordination, PFM strength, urinary continence as evidenced by 50% frequency of urinary incontinence with stressor present, increased L lumbar paraspinal tone, L sided pain with R lumbar lateral flexion and R lumbar rotation. Patient's responses on FOTO outcome measures (Urinary 54, Constipation 55) indicate moderate functional limitations/disability/distress. Patient's progress may be limited due to persistence of symptoms; however, patient's motivation is  advantageous. Patient was able to achieve basic understanding of typical bladder function and PFM function during today's evaluation and responded positively to educational interventions. Patient will benefit from continued skilled therapeutic intervention to address deficits in posture, spinal mobility, PFM coordination, PFM strength, urinary continence in order to increase function, and improve overall QOL.    Personal Factors and Comorbidities Age;Education;Time since onset of injury/illness/exacerbation;Past/Current Experience;Comorbidity 3+;Fitness;Behavior Pattern    Comorbidities GERD, CIN, VIN, hx of hysterectomy    Examination-Activity Limitations Transfers;Squat;Lift;Bend;Locomotion Level;Continence;Stairs;Other    Examination-Participation Restrictions Community Activity;Driving;Interpersonal Relationship    Stability/Clinical Decision Making Evolving/Moderate complexity    Clinical Decision Making Moderate    Rehab Potential Good    PT Frequency 1x / week    PT Duration 12 weeks    PT Treatment/Interventions ADLs/Self Care Home Management;Biofeedback;Moist Heat;Cryotherapy;Electrical Stimulation;Therapeutic activities;Functional mobility training;Stair training;Gait training;Therapeutic exercise;Balance training;Neuromuscular re-education;Orthotic Fit/Training;Patient/family education;Manual techniques;Dry needling;Taping;Scar mobilization;Spinal Manipulations;Joint Manipulations;Passive range of motion  PT Next Visit Plan External PFM assessment; IAP basics; reverse kegels    PT Home Exercise Plan bladder diary; diaphragmatic breathing    Consulted and Agree with Plan of Care Patient           Patient will benefit from skilled therapeutic intervention in order to improve the following deficits and impairments:  Abnormal gait, Decreased balance, Decreased endurance, Increased muscle spasms, Pain, Postural dysfunction, Improper body mechanics, Decreased strength, Decreased  coordination, Decreased activity tolerance, Decreased range of motion, Impaired flexibility  Visit Diagnosis: Other lack of coordination  Muscle weakness (generalized)  Abnormal posture     Problem List Patient Active Problem List   Diagnosis Date Noted  . Allergic rhinitis 03/23/2018  . 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 Abnormal Pap smear of cervix 10/20/2017   Myles Gip PT, DPT [phone] 11/15/2019, 3:30 PM  [facility] [address] [place], [place], [address] Phone: [phone]   Fax:  [phone]  Name: Carrol Hougland MRN: [id number] Date of Birth: February 08, 1971

## 2019-11-15 NOTE — Progress Notes (Signed)
Gynecology Annual Exam  PCP: Valerie Roys, DO  Chief Complaint:  Chief Complaint  Patient presents with  . Gynecologic Exam    History of Present Illness: Patient is a 49 y.o. G1P1 presents for annual exam. The patient has no complaints today.   LMP: No LMP recorded. Patient has had a hysterectomy.   The patient is sexually active. She currently uses status post hysterectomy for contraception. She denies dyspareunia.  The patient does perform self breast exams.  There is no notable family history of breast or ovarian cancer in her family.  The patient wears seatbelts: yes.   The patient has regular exercise: not asked.    The patient denies current symptoms of depression.    Review of Systems: Review of Systems  Constitutional: Negative for chills and fever.  HENT: Negative for congestion.   Respiratory: Negative for cough and shortness of breath.   Cardiovascular: Negative for chest pain and palpitations.  Gastrointestinal: Negative for abdominal pain, constipation, diarrhea, heartburn, nausea and vomiting.  Genitourinary: Negative for dysuria, frequency and urgency.  Skin: Negative for itching and rash.  Neurological: Negative for dizziness and headaches.  Endo/Heme/Allergies: Negative for polydipsia.  Psychiatric/Behavioral: Negative for depression.    Past Medical History:  Patient Active Problem List   Diagnosis Date Noted  . Allergic rhinitis 03/23/2018  . 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 Abnormal Pap smear of cervix 10/20/2017    10/26/03 ASCUS 05/20/05 NIL 06/11/07 NIL 07/11/08 NIL  07/13/09 NIL HPV negative 08/17/09 Supracervical hysterectomy and BSO 07/16/10 NIL 07/20/12 NIL HPV positive 08/03/13 NIL HPV negative 08/08/14 LSIL HPV negative 08/23/14 Colposcopy VAIN I 02/16/15 LSIL HPV negative 08/15/15 LSIL HPV negative 08/24/15 Colposcopy VAIN I 09/22/15 OR excision VAIN I left sidewall, ECC CIN I, 12 O'Clock ectocervix CIN I 09/11/16 ASCUS 10/17/16  Colposcopy negative 09/16/17 NIL HPV negative      Past Surgical History:  Past Surgical History:  Procedure Laterality Date  . ABDOMINAL HYSTERECTOMY    . COLPOSCOPY N/A 09/22/2015   Procedure: COLPOSCOPY;  Surgeon: Malachy Mood, MD;  Location: [facility] ORS;  Service: Gynecology;  Laterality: N/A;  . LESION REMOVAL N/A 09/22/2015   Procedure: EXCISION VAGINAL LESION;  Surgeon: Malachy Mood, MD;  Location: [facility] ORS;  Service: Gynecology;  Laterality: N/A;    Gynecologic History:  No LMP recorded. Patient has had a hysterectomy. Contraception: status post hysterectomy Last Pap: Results were: 11/12/2018 NIL and HR HPV negative  Last mammogram: 11/20/2018 Results were: BI-RAD I  Obstetric History: G1P1  Family History:  Family History  Problem Relation Age of Onset  . Skin cancer Mother 19  . Glaucoma Mother   . Diabetes Maternal Uncle   . Skin cancer Maternal Grandmother   . Leukemia Maternal Grandmother   . CAD Maternal Grandmother   . Glaucoma Maternal Grandmother   . CAD Father   . Hyperlipidemia Father   . Obesity Sister   . Osteoarthritis Sister   . Breast cancer Paternal Aunt 85    Social History:  Social History   Socioeconomic History  . Marital status: Married    Spouse name: Not on file  . Number of children: Not on file  . Years of education: Not on file  . Highest education level: Not on file  Occupational History  . Not on file  Tobacco Use  . Smoking status: Never Smoker  . Smokeless tobacco: Never Used  Vaping Use  . Vaping Use: Never used  Substance and Sexual  Activity  . Alcohol use: No  . Drug use: No  . Sexual activity: Yes    Birth control/protection: Surgical    Comment: Hysterectomy  Other Topics Concern  . Not on file  Social History Narrative  . Not on file   Social Determinants of Health   Financial Resource Strain:   . Difficulty of Paying Living Expenses:   Food Insecurity:   . Worried About Programme researcher, broadcasting/film/video in the  Last Year:   . Barista in the Last Year:   Transportation Needs:   . Freight forwarder (Medical):   Marland Kitchen Lack of Transportation (Non-Medical):   Physical Activity:   . Days of Exercise per Week:   . Minutes of Exercise per Session:   Stress:   . Feeling of Stress :   Social Connections:   . Frequency of Communication with Friends and Family:   . Frequency of Social Gatherings with Friends and Family:   . Attends Religious Services:   . Active Member of Clubs or Organizations:   . Attends Banker Meetings:   Marland Kitchen Marital Status:   Intimate Partner Violence:   . Fear of Current or Ex-Partner:   . Emotionally Abused:   Marland Kitchen Physically Abused:   . Sexually Abused:     Allergies:  Allergies  Allergen Reactions  . Erythromycin Hives  . Keflex [Cephalexin] Hives  . Penicillin G     Other reaction(s): Unknown    Medications: Prior to Admission medications   Medication Sig Start Date End Date Taking? Authorizing Provider  azelastine (ASTELIN) 0.1 % nasal spray Place 2 sprays into both nostrils 2 (two) times daily. Use in each nostril as directed 03/29/19  Yes Johnson, Megan P, DO  Biotin 10 MG CAPS Take by mouth.   Yes [provider]  calcium carbonate 1250 MG capsule Take 1,250 mg by mouth daily.   Yes [provider]  Cholecalciferol (VITAMIN D3) 2000 units capsule Take by mouth.   Yes [provider]  estradiol (ESTRACE VAGINAL) 0.1 MG/GM vaginal cream 1 gram vaginally twice weekly 08/20/18  Yes Vena Austria, MD  Lactobacillus Acid-Pectin (ACIDOPHILUS/PECTIN) CAPS Take by mouth.   Yes [provider]  Multiple Vitamin (MULTIVITAMIN) capsule Take 1 capsule by mouth daily.   Yes [provider]  Multiple Vitamins-Minerals (ZINC PO) Take by mouth daily.   Yes [provider]  NAFTIN 2 % GEL APPLY DAILY TO AFFECTED AREA(S) PLUS A 0.5 INCH MARGIN TO HEALTHY, SURROUNDING SKIN FOR 2 WEEKS 12/12/16  Yes Copland,  Alicia B, PA-C  ranitidine (ZANTAC) 75 MG tablet Take 75 mg by mouth daily as needed for heartburn.   Yes [provider]  spironolactone (ALDACTONE) 50 MG tablet 2(TWO) TABLET(S) ORAL EVERY DAY 07/29/17  Yes [provider]  TURMERIC PO Take by mouth daily.   Yes [provider]  valACYclovir (VALTREX) 500 MG tablet TAKE 1 TABLET BY MOUTH  DAILY 06/15/19  Yes Vena Austria, MD  vitamin C (ASCORBIC ACID) 500 MG tablet Take by mouth.   Yes [provider]  montelukast (SINGULAIR) 10 MG tablet TAKE 1 TABLET BY MOUTH EVERYDAY AT BEDTIME 03/26/19   Dorcas Carrow, DO    Physical Exam Vitals: Blood pressure 110/66, pulse 73, height 5\' 2"  (1.575 m), weight 122 lb (55.3 kg).  General: NAD HEENT: normocephalic, anicteric Thyroid: no enlargement, no palpable nodules Pulmonary: No increased work of breathing, CTAB Cardiovascular: RRR, distal pulses 2+ Breast: Breast  symmetrical, no tenderness, no palpable nodules or masses, no skin or nipple retraction present, no nipple discharge.  No axillary or supraclavicular lymphadenopathy. Abdomen: NABS, soft, non-tender, non-distended.  Umbilicus without lesions.  No hepatomegaly, splenomegaly or masses palpable. No evidence of hernia  Genitourinary:  External: Normal external female genitalia.  Normal urethral meatus, normal Bartholin's and Skene's glands.    Vagina: Normal vaginal mucosa, no evidence of prolapse.    Cervix: surgically absent  Uterus: surgically absent  Adnexa: ovaries non-enlarged, no adnexal masses  Rectal: deferred  Lymphatic: no evidence of inguinal lymphadenopathy Extremities: no edema, erythema, or tenderness Neurologic: Grossly intact Psychiatric: mood appropriate, affect full  Female chaperone present for pelvic and breast  portions of the physical exam    Assessment: 49 y.o. G1P1 routine annual exam  Plan: Problem List Items Addressed This Visit    None    Visit Diagnoses     Encounter for gynecological examination without abnormal finding    -  Primary   Screening for malignant neoplasm of cervix       Relevant Orders   Cytology - PAP   Breast screening       Relevant Orders   MM 3D SCREEN BREAST BILATERAL   SUI (stress urinary incontinence, female)       Relevant Orders   Ambulatory referral to Physical Therapy      1) Mammogram - recommend yearly screening mammogram.  Mammogram Was ordered today   2) STI screening  was notoffered and therefore not obtained  3) ASCCP guidelines and rational discussed.  Patient opts for yearly screening interval because of history of VAIN  4) Contraception - the patient is currently using  status post hysterectomy.    5) Colonoscopy -- start at age 35  6) Routine healthcare maintenance including cholesterol, diabetes screening discussed managed by PCP  7) SUI - referral to pelvic PT, no evidence of prolapse on exam today  8) Return in about 1 year (around 11/14/2020) for Annual.   Vena Austria, MD, [facility], [facility] 11/15/2019, 8:52 AM

## 2019-11-15 NOTE — Addendum Note (Signed)
Addended by: Kathryne Eriksson on: 11/15/2019 03:33 PM   Modules accepted: Orders

## 2019-11-15 NOTE — Patient Instructions (Signed)
[facility] [address] [place] [address]  [facility]  [address] [place] [address]  Phone: [phone]  

## 2019-11-16 LAB — CYTOLOGY - PAP
Comment: NEGATIVE
High risk HPV: NEGATIVE

## 2019-11-18 ENCOUNTER — Ambulatory Visit: Payer: Commercial Managed Care - PPO | Admitting: Physical Therapy

## 2019-11-22 ENCOUNTER — Ambulatory Visit: Payer: Commercial Managed Care - PPO | Admitting: Physical Therapy

## 2019-11-22 ENCOUNTER — Other Ambulatory Visit: Payer: Self-pay

## 2019-11-22 ENCOUNTER — Encounter: Payer: Self-pay | Admitting: Physical Therapy

## 2019-11-22 DIAGNOSIS — M6281 Muscle weakness (generalized): Secondary | ICD-10-CM

## 2019-11-22 DIAGNOSIS — R278 Other lack of coordination: Secondary | ICD-10-CM

## 2019-11-22 DIAGNOSIS — R293 Abnormal posture: Secondary | ICD-10-CM

## 2019-11-22 NOTE — Therapy (Signed)
[facility] [address] [place], [place], [address] Phone: [phone]   Fax:  [phone]  Physical Therapy Treatment  Patient Details  Name: Heidi Rodriguez MRN: [id number] Date of Birth: 1970-10-03 Referring Provider (PT): Vena Austria   Encounter Date: 11/22/2019   PT End of Session - 11/22/19 1606    Visit Number 2    Number of Visits 12    Date for PT Re-Evaluation 02/07/20    PT Start Time 1600    PT Stop Time 1655    PT Time Calculation (min) 55 min    Activity Tolerance Patient tolerated treatment well    Behavior During Therapy [facility] for tasks assessed/performed           Past Medical History:  Diagnosis Date  . Abnormal cytology   . CIN I (cervical intraepithelial neoplasia I)   . GERD (gastroesophageal reflux disease)   . Herpes genitalis   . PONV (postoperative nausea and vomiting)   . Vulvar intraepithelial neoplasia (VIN) grade 1     Past Surgical History:  Procedure Laterality Date  . ABDOMINAL HYSTERECTOMY    . COLPOSCOPY N/A 09/22/2015   Procedure: COLPOSCOPY;  Surgeon: Vena Austria, MD;  Location: [facility] ORS;  Service: Gynecology;  Laterality: N/A;  . LESION REMOVAL N/A 09/22/2015   Procedure: EXCISION VAGINAL LESION;  Surgeon: Vena Austria, MD;  Location: [facility] ORS;  Service: Gynecology;  Laterality: N/A;    There were no vitals filed for this visit.   Subjective Assessment - 11/22/19 1604    Subjective Patient presents completed bladder diary with no indication of increased frequency/urgency without commensurate amount of hydration/filling. Patient denies any incidents of UI since her evaluation. She notes she almost had an incident this morning after walking dogs.    Currently in Pain? No/denies           TREATMENT  Pre-treatment assessment:  ABDOMINAL:  Palpation: no TTP Diastasis:  None present Rib flare: significant in supine on L; able to correct with cueing  EXTERNAL  PELVIC EXAM:  Palpation: no TTP Breath coordination: present minimally Cued Lengthen: significant contributions from pelvic tilt and abdominals Cued Contraction: significant contributions from adductors and abdominals Cough: paradoxical  Adductor length: R 20 degrees, L 28 degrees  Neuromuscular Re-education: Supine hooklying diaphragmatic breathing with VCs and TCs for downregulation of the nervous system and improved management of IAP Sidelying, thoracolumbar rotations (open-book) bilaterally with diaphragmatic breathing for improved diaphragmatic and rib cage excursion. VCs and TCs to prevent compensations. Supine hooklying, PFM lengthening with inhalation. VCs and TCs to decrease compensatory patterns and encourage optimal relaxation of the PFM. Supine hooklying, PFM contractionswith exhalation. VCs and TCs to decrease compensatory patterns and encourage activation of the PFM. Seated, PFM lengthening with inhalation, washcloth feedback. VCs and TCs to decrease compensatory patterns and encourage optimal relaxation of the PFM. Seated, PFM contractions with exhalation, washcloth feedback. VCs and TCs to decrease compensatory patterns and encourage activation of the PFM. Adductor stretching options for decreased PFM tension  Patient educated throughout session on appropriate technique and form using multi-modal cueing, HEP, and activity modification. Patient articulated understanding and returned demonstration.  Patient Response to interventions: Comfortable with HEP  ASSESSMENT Patient presents to clinic with excellent motivation to participate in therapy. Patient demonstrates deficits in posture, spinal mobility, PFM coordination, PFM strength, urinary continence. Patient able to achieve palpable PFM lengthen and lift with tactile and verbal feedback during today's session and responded positively to active interventions. Patient  will benefit from continued skilled therapeutic intervention  to address remaining deficits in posture, spinal mobility, PFM coordination, PFM strength, urinary continence in order to increase function and improve overall QOL.      PT Long Term Goals - 11/15/19 1528      PT LONG TERM GOAL #1   Title Patient will demonstrate independence with HEP in order to maximize therapeutic gains and improve carryover from physical therapy sessions to ADLs in the home and community.    Baseline IE: not demonstrated    Time 12    Period Weeks    Status New    Target Date 02/07/20      PT LONG TERM GOAL #2   Title Patient will demonstrate independent and coordinated diaphragmatic breathing in supine with a 1:2 breathing pattern for improved down-regulation of the nervous system and improved management of intra-abdominal pressures in order to increase function at home and in the community.    Baseline IE: not demonstrated    Time 12    Period Weeks    Status New    Target Date 02/07/20      PT LONG TERM GOAL #3   Title Patient will demonstrate improved function as evidenced by a score of Urinary 66 on FOTO measure for full participation in activities at home and in the community.    Baseline IE: 55    Time 12    Period Weeks    Status New    Target Date 02/07/20      PT LONG TERM GOAL #4   Title Patient will demonstrate improved function as evidenced by a score of Bowel Constipation 64 on FOTO measure for full participation in activities at home and in the community.    Baseline IE: 61    Time 12    Period Weeks    Status New    Target Date 02/07/20                 Plan - 11/22/19 1607    Clinical Impression Statement Patient presents to clinic with excellent motivation to participate in therapy. Patient demonstrates deficits in posture, spinal mobility, PFM coordination, PFM strength, urinary continence. Patient able to achieve palpable PFM lengthen and lift with tactile and verbal feedback during today's session and responded positively to  active interventions. Patient will benefit from continued skilled therapeutic intervention to address remaining deficits in posture, spinal mobility, PFM coordination, PFM strength, urinary continence in order to increase function and improve overall QOL.    Personal Factors and Comorbidities Age;Education;Time since onset of injury/illness/exacerbation;Past/Current Experience;Comorbidity 3+;Fitness;Behavior Pattern    Comorbidities GERD, CIN, VIN, hx of hysterectomy    Examination-Activity Limitations Transfers;Squat;Lift;Bend;Locomotion Level;Continence;Stairs;Other    Examination-Participation Restrictions Community Activity;Driving;Interpersonal Relationship    Stability/Clinical Decision Making Evolving/Moderate complexity    Rehab Potential Good    PT Frequency 1x / week    PT Duration 12 weeks    PT Treatment/Interventions ADLs/Self Care Home Management;Biofeedback;Moist Heat;Cryotherapy;Electrical Stimulation;Therapeutic activities;Functional mobility training;Stair training;Gait training;Therapeutic exercise;Balance training;Neuromuscular re-education;Orthotic Fit/Training;Patient/family education;Manual techniques;Dry needling;Taping;Scar mobilization;Spinal Manipulations;Joint Manipulations;Passive range of motion    PT Next Visit Plan External PFM assessment; IAP basics; reverse kegels    PT Home Exercise Plan bladder diary; diaphragmatic breathing    Consulted and Agree with Plan of Care Patient           Patient will benefit from skilled therapeutic intervention in order to improve the following deficits and impairments:  Abnormal gait, Decreased balance, Decreased endurance, Increased muscle spasms,  Pain, Postural dysfunction, Improper body mechanics, Decreased strength, Decreased coordination, Decreased activity tolerance, Decreased range of motion, Impaired flexibility  Visit Diagnosis: Other lack of coordination  Muscle weakness (generalized)  Abnormal  posture     Problem List Patient Active Problem List   Diagnosis Date Noted  . Allergic rhinitis 03/23/2018  . 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 Abnormal Pap smear of cervix 10/20/2017   Sheria Lang PT, DPT [phone] 11/23/2019, 8:32 AM  [facility] [address] [place], [place], [address] Phone: [phone]   Fax:  [phone]  Name: Heidi Rodriguez MRN: [id number] Date of Birth: 10-Sep-1970

## 2019-11-23 ENCOUNTER — Ambulatory Visit
Admission: RE | Admit: 2019-11-23 | Discharge: 2019-11-23 | Disposition: A | Discharge status: Home / Self Care | Payer: Commercial Managed Care - PPO | Source: Ambulatory Visit | Attending: Family Medicine | Admitting: Family Medicine

## 2019-11-23 DIAGNOSIS — Z1231 Encounter for screening mammogram for malignant neoplasm of breast: Secondary | ICD-10-CM | POA: Diagnosis present

## 2019-11-29 ENCOUNTER — Ambulatory Visit: Payer: Commercial Managed Care - PPO | Admitting: Physical Therapy

## 2019-12-07 ENCOUNTER — Encounter: Payer: Self-pay | Admitting: Physical Therapy

## 2019-12-07 ENCOUNTER — Ambulatory Visit: Payer: Commercial Managed Care - PPO | Attending: Obstetrics and Gynecology | Admitting: Physical Therapy

## 2019-12-07 ENCOUNTER — Other Ambulatory Visit: Payer: Self-pay

## 2019-12-07 DIAGNOSIS — R293 Abnormal posture: Secondary | ICD-10-CM | POA: Insufficient documentation

## 2019-12-07 DIAGNOSIS — R278 Other lack of coordination: Secondary | ICD-10-CM | POA: Diagnosis not present

## 2019-12-07 DIAGNOSIS — M6281 Muscle weakness (generalized): Secondary | ICD-10-CM

## 2019-12-07 NOTE — Therapy (Signed)
[facility] [address] [place], [place], [address] Phone: [phone]   Fax:  [phone]  Physical Therapy Treatment  Patient Details  Name: Heidi Rodriguez MRN: [id number] Date of Birth: 1971/03/14 Referring Provider (PT): Vena Austria   Encounter Date: 12/07/2019   PT End of Session - 12/07/19 1607    Visit Number 3    Number of Visits 12    Date for PT Re-Evaluation 02/07/20    PT Start Time 1600    PT Stop Time 1655    PT Time Calculation (min) 55 min    Activity Tolerance Patient tolerated treatment well    Behavior During Therapy [facility] for tasks assessed/performed           Past Medical History:  Diagnosis Date  . Abnormal cytology   . CIN I (cervical intraepithelial neoplasia I)   . GERD (gastroesophageal reflux disease)   . Herpes genitalis   . PONV (postoperative nausea and vomiting)   . Vulvar intraepithelial neoplasia (VIN) grade 1     Past Surgical History:  Procedure Laterality Date  . ABDOMINAL HYSTERECTOMY    . COLPOSCOPY N/A 09/22/2015   Procedure: COLPOSCOPY;  Surgeon: Vena Austria, MD;  Location: [facility] ORS;  Service: Gynecology;  Laterality: N/A;  . LESION REMOVAL N/A 09/22/2015   Procedure: EXCISION VAGINAL LESION;  Surgeon: Vena Austria, MD;  Location: [facility] OR[facility];  Service: Gynecology;  Laterality: N/A;    There were no vitals filed for this visit.   Subjective Assessment - 12/07/19 1605    Subjective Patient notes that she had a nice vacation and is feeling much more coordinated with her PFM exercises. Patient adds that she was able to drive 5 hours without stopping to use the restroom and no increased urgency when arriving at her destination. Patient denies any UI since her last visit. Patient notes she is having some resolution of constipation issues but also notes not sticking as strictly to a keto diet.    Currently in Pain? No/denies           TREATMENT  Neuromuscular  Re-education: Supine hooklying diaphragmatic breathing with VCs and TCs for downregulation of the nervous system and improved management of IAP Supine hooklying, PFM contractions with exhalation: endurance hold 3 sec x5; quick flicks x6 in 10 sec. VCs and TCs to decrease compensatory patterns and encourage activation of the PFM. Patient education on progression of PFM strengthening program and signs/symptoms for overtraining.  Patient educated throughout session on appropriate technique and form using multi-modal cueing, HEP, and activity modification. Patient articulated understanding and returned demonstration.  Patient Response to interventions: Comfortable with HEP.  ASSESSMENT Patient presents to clinic with excellent motivation to participate in therapy. Patient demonstrates deficits in posture, spinal mobility, PFM coordination, PFM strength, urinary continence. Patient able to achieve 6 fast twitch PFM contractions in 10 seconds during today's session and responded positively to active interventions. Patient will benefit from continued skilled therapeutic intervention to address remaining deficits in posture, spinal mobility, PFM coordination, PFM strength, urinary continence in order to increase function and improve overall QOL.      PT Long Term Goals - 11/15/19 1528      PT LONG TERM GOAL #1   Title Patient will demonstrate independence with HEP in order to maximize therapeutic gains and improve carryover from physical therapy sessions to ADLs in the home and community.    Baseline IE: not demonstrated    Time 12    Period  Weeks    Status New    Target Date 02/07/20      PT LONG TERM GOAL #2   Title Patient will demonstrate independent and coordinated diaphragmatic breathing in supine with a 1:2 breathing pattern for improved down-regulation of the nervous system and improved management of intra-abdominal pressures in order to increase function at home and in the community.     Baseline IE: not demonstrated    Time 12    Period Weeks    Status New    Target Date 02/07/20      PT LONG TERM GOAL #3   Title Patient will demonstrate improved function as evidenced by a score of Urinary 66 on FOTO measure for full participation in activities at home and in the community.    Baseline IE: 54    Time 12    Period Weeks    Status New    Target Date 02/07/20      PT LONG TERM GOAL #4   Title Patient will demonstrate improved function as evidenced by a score of Bowel Constipation 64 on FOTO measure for full participation in activities at home and in the community.    Baseline IE: 55    Time 12    Period Weeks    Status New    Target Date 02/07/20                 Plan - 12/07/19 1607    Clinical Impression Statement Patient presents to clinic with excellent motivation to participate in therapy. Patient demonstrates deficits in posture, spinal mobility, PFM coordination, PFM strength, urinary continence. Patient able to achieve 6 fast twitch PFM contractions in 10 seconds during today's session and responded positively to active interventions. Patient will benefit from continued skilled therapeutic intervention to address remaining deficits in posture, spinal mobility, PFM coordination, PFM strength, urinary continence in order to increase function and improve overall QOL.    Personal Factors and Comorbidities Age;Education;Time since onset of injury/illness/exacerbation;Past/Current Experience;Comorbidity 3+;Fitness;Behavior Pattern    Comorbidities GERD, CIN, VIN, hx of hysterectomy    Examination-Activity Limitations Transfers;Squat;Lift;Bend;Locomotion Level;Continence;Stairs;Other    Examination-Participation Restrictions Community Activity;Driving;Interpersonal Relationship    Stability/Clinical Decision Making Evolving/Moderate complexity    Rehab Potential Good    PT Frequency 1x / week    PT Duration 12 weeks    PT Treatment/Interventions ADLs/Self Care  Home Management;Biofeedback;Moist Heat;Cryotherapy;Electrical Stimulation;Therapeutic activities;Functional mobility training;Stair training;Gait training;Therapeutic exercise;Balance training;Neuromuscular re-education;Orthotic Fit/Training;Patient/family education;Manual techniques;Dry needling;Taping;Scar mobilization;Spinal Manipulations;Joint Manipulations;Passive range of motion    PT Next Visit Plan External PFM assessment; IAP basics; reverse kegels    PT Home Exercise Plan bladder diary; diaphragmatic breathing    Consulted and Agree with Plan of Care Patient           Patient will benefit from skilled therapeutic intervention in order to improve the following deficits and impairments:  Abnormal gait, Decreased balance, Decreased endurance, Increased muscle spasms, Pain, Postural dysfunction, Improper body mechanics, Decreased strength, Decreased coordination, Decreased activity tolerance, Decreased range of motion, Impaired flexibility  Visit Diagnosis: Other lack of coordination  Muscle weakness (generalized)  Abnormal posture     Problem List Patient Active Problem List   Diagnosis Date Noted  . Allergic rhinitis 03/23/2018  . 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 Abnormal Pap smear of cervix 10/20/2017   Sheria Lang PT, DPT [id number] 12/07/2019, 5:49 PM   [facility] [address] [place], [place], [address] Phone: [phone]   Fax:  [phone]  Name: Heidi Rodriguez MRN: [id number] Date of Birth: 02-04-1971

## 2019-12-13 ENCOUNTER — Ambulatory Visit: Payer: Commercial Managed Care - PPO | Admitting: Physical Therapy

## 2020-01-03 ENCOUNTER — Encounter: Payer: Commercial Managed Care - PPO | Admitting: Physical Therapy

## 2020-03-31 ENCOUNTER — Encounter: Payer: Self-pay | Admitting: Family Medicine

## 2020-03-31 ENCOUNTER — Ambulatory Visit (INDEPENDENT_AMBULATORY_CARE_PROVIDER_SITE_OTHER): Payer: Commercial Managed Care - PPO | Admitting: Family Medicine

## 2020-03-31 ENCOUNTER — Other Ambulatory Visit: Payer: Self-pay

## 2020-03-31 VITALS — BP 109/71 | HR 75 | Temp 98.2°F | Ht 61.4 in | Wt 122.4 lb

## 2020-03-31 DIAGNOSIS — M7711 Lateral epicondylitis, right elbow: Secondary | ICD-10-CM

## 2020-03-31 DIAGNOSIS — Z23 Encounter for immunization: Secondary | ICD-10-CM | POA: Diagnosis not present

## 2020-03-31 DIAGNOSIS — R151 Fecal smearing: Secondary | ICD-10-CM

## 2020-03-31 DIAGNOSIS — Z1211 Encounter for screening for malignant neoplasm of colon: Secondary | ICD-10-CM

## 2020-03-31 DIAGNOSIS — Z0000 Encounter for general adult medical examination without abnormal findings: Secondary | ICD-10-CM

## 2020-03-31 LAB — URINALYSIS, ROUTINE W REFLEX MICROSCOPIC
Bilirubin, UA: NEGATIVE
Glucose, UA: NEGATIVE
Ketones, UA: NEGATIVE
Leukocytes,UA: NEGATIVE
Nitrite, UA: NEGATIVE
Protein,UA: NEGATIVE
RBC, UA: NEGATIVE
Specific Gravity, UA: 1.01 (ref 1.005–1.030)
Urobilinogen, Ur: 0.2 mg/dL (ref 0.2–1.0)
pH, UA: 7 (ref 5.0–7.5)

## 2020-03-31 MED ORDER — AZELASTINE HCL 0.1 % NA SOLN
2.0000 | Freq: Two times a day (BID) | NASAL | 12 refills | Status: DC
Start: 2020-03-31 — End: 2021-04-02

## 2020-03-31 MED ORDER — ESTRADIOL 0.1 MG/GM VA CREA: TOPICAL_CREAM | VAGINAL | 3 refills | Status: DC

## 2020-03-31 MED ORDER — VALACYCLOVIR HCL 500 MG PO TABS
500.0000 mg | ORAL_TABLET | Freq: Every day | ORAL | 3 refills | Status: DC
Start: 2020-03-31 — End: 2021-03-09

## 2020-03-31 MED ORDER — SPIRONOLACTONE 50 MG PO TABS
ORAL_TABLET | ORAL | 3 refills | Status: DC
Start: 2020-03-31 — End: 2021-04-02

## 2020-03-31 NOTE — Patient Instructions (Addendum)
Influenza (Flu) Vaccine (Inactivated or Recombinant): What You Need to Know 1. Why get vaccinated? Influenza vaccine can prevent influenza (flu). Flu is a contagious disease that spreads around the [place] every year, usually between October and May. Anyone can get the flu, but it is more dangerous for some people. Infants and young children, people 47 years of age and older, pregnant women, and people with certain health conditions or a weakened immune system are at greatest risk of flu complications. Pneumonia, bronchitis, sinus infections and ear infections are examples of flu-related complications. If you have a medical condition, such as heart disease, cancer or diabetes, flu can make it worse. Flu can cause fever and chills, sore throat, muscle aches, fatigue, cough, headache, and runny or stuffy nose. Some people may have vomiting and diarrhea, though this is more common in children than adults. Each year thousands of people in the [place] die from flu, and many more are hospitalized. Flu vaccine prevents millions of illnesses and flu-related visits to the doctor each year. 2. Influenza vaccine CDC recommends everyone 57 months of age and older get vaccinated every flu season. Children 6 months through 2 years of age may need 2 doses during a single flu season. Everyone else needs only 1 dose each flu season. It takes about 2 weeks for protection to develop after vaccination. There are many flu viruses, and they are always changing. Each year a new flu vaccine is made to protect against three or four viruses that are likely to cause disease in the upcoming flu season. Even when the vaccine doesn't exactly match these viruses, it may still provide some protection. Influenza vaccine does not cause flu. Influenza vaccine may be given at the same time as other vaccines. 3. Talk with your health care provider Tell your vaccine provider if the person getting the vaccine:  Has had an  allergic reaction after a previous dose of influenza vaccine, or has any severe, life-threatening allergies.  Has ever had Guillain-Barr Syndrome (also called GBS). In some cases, your health care provider may decide to postpone influenza vaccination to a future visit. People with minor illnesses, such as a cold, may be vaccinated. People who are moderately or severely ill should usually wait until they recover before getting influenza vaccine. Your health care provider can give you more information. 4. Risks of a vaccine reaction  Soreness, redness, and swelling where shot is given, fever, muscle aches, and headache can happen after influenza vaccine.  There may be a very small increased risk of Guillain-Barr Syndrome (GBS) after inactivated influenza vaccine (the flu shot). Young children who get the flu shot along with pneumococcal vaccine (PCV13), and/or DTaP vaccine at the same time might be slightly more likely to have a seizure caused by fever. Tell your health care provider if a child who is getting flu vaccine has ever had a seizure. People sometimes faint after medical procedures, including vaccination. Tell your provider if you feel dizzy or have vision changes or ringing in the ears. As with any medicine, there is a very remote chance of a vaccine causing a severe allergic reaction, other serious injury, or death. 5. What if there is a serious problem? An allergic reaction could occur after the vaccinated person leaves the clinic. If you see signs of a severe allergic reaction (hives, swelling of the face and throat, difficulty breathing, a fast heartbeat, dizziness, or weakness), call 9-1-1 and get the person to the nearest hospital. For other signs that  concern you, call your health care provider. Adverse reactions should be reported to the Vaccine Adverse Event Reporting System (VAERS). Your health care provider will usually file this report, or you can do it yourself. Visit the  VAERS website at www.vaers.SamedayNews.es or call 820-590-8129.VAERS is only for reporting reactions, and VAERS staff do not give medical advice. 6. The National Vaccine Injury Compensation Program The [employer] Vaccine Injury Compensation Program (VICP) is a federal program that was created to compensate people who may have been injured by certain vaccines. Visit the VICP website at GoldCloset.com.ee or call 539-870-3234 to learn about the program and about filing a claim. There is a time limit to file a claim for compensation. 7. How can I learn more?  Ask your healthcare provider.  Call your local or state health department.  Contact the Centers for Disease Control and Prevention (CDC): ? Call 938-326-4711 (1-800-CDC-INFO) or ? Visit CDC's https://gibson.com/ Vaccine Information Statement (Interim) Inactivated Influenza Vaccine (01/15/2018) This information is not intended to replace advice given to you by your health care provider. Make sure you discuss any questions you have with your health care provider. Document Revised: 09/08/2018 Document Reviewed: 01/19/2018 Elsevier Patient Education  [place] Maintenance, Female Adopting a healthy lifestyle and getting preventive care are important in promoting health and wellness. Ask your health care provider about:  The right schedule for you to have regular tests and exams.  Things you can do on your own to prevent diseases and keep yourself healthy. What should I know about diet, weight, and exercise? Eat a healthy diet   Eat a diet that includes plenty of vegetables, fruits, low-fat dairy products, and lean protein.  Do not eat a lot of foods that are high in solid fats, added sugars, or sodium. Maintain a healthy weight Body mass index (BMI) is used to identify weight problems. It estimates body fat based on height and weight. Your health care provider can help determine your BMI and help you achieve or  maintain a healthy weight. Get regular exercise Get regular exercise. This is one of the most important things you can do for your health. Most adults should:  Exercise for at least 150 minutes each week. The exercise should increase your heart rate and make you sweat (moderate-intensity exercise).  Do strengthening exercises at least twice a week. This is in addition to the moderate-intensity exercise.  Spend less time sitting. Even light physical activity can be beneficial. Watch cholesterol and blood lipids Have your blood tested for lipids and cholesterol at 47 years of age, then have this test every 5 years. Have your cholesterol levels checked more often if:  Your lipid or cholesterol levels are high.  You are older than 47 years of age.  You are at high risk for heart disease. What should I know about cancer screening? Depending on your health history and family history, you may need to have cancer screening at various ages. This may include screening for:  Breast cancer.  Cervical cancer.  Colorectal cancer.  Skin cancer.  Lung cancer. What should I know about heart disease, diabetes, and high blood pressure? Blood pressure and heart disease  High blood pressure causes heart disease and increases the risk of stroke. This is more likely to develop in people who have high blood pressure readings, are of African descent, or are overweight.  Have your blood pressure checked: ? Every 3-5 years if you are 109-67 years of age. ? Every year if  you are 78 years old or older. Diabetes Have regular diabetes screenings. This checks your fasting blood sugar level. Have the screening done:  Once every three years after age 96 if you are at a normal weight and have a low risk for diabetes.  More often and at a younger age if you are overweight or have a high risk for diabetes. What should I know about preventing infection? Hepatitis B If you have a higher risk for hepatitis B,  you should be screened for this virus. Talk with your health care provider to find out if you are at risk for hepatitis B infection. Hepatitis C Testing is recommended for:  Everyone born from [birth year] through 1963.  Anyone with known risk factors for hepatitis C. Sexually transmitted infections (STIs)  Get screened for STIs, including gonorrhea and chlamydia, if: ? You are sexually active and are younger than 47 years of age. ? You are older than 47 years of age and your health care provider tells you that you are at risk for this type of infection. ? Your sexual activity has changed since you were last screened, and you are at increased risk for chlamydia or gonorrhea. Ask your health care provider if you are at risk.  Ask your health care provider about whether you are at high risk for HIV. Your health care provider may recommend a prescription medicine to help prevent HIV infection. If you choose to take medicine to prevent HIV, you should first get tested for HIV. You should then be tested every 3 months for as long as you are taking the medicine. Pregnancy  If you are about to stop having your period (premenopausal) and you may become pregnant, seek counseling before you get pregnant.  Take 400 to 800 micrograms (mcg) of folic acid every day if you become pregnant.  Ask for birth control (contraception) if you want to prevent pregnancy. Osteoporosis and menopause Osteoporosis is a disease in which the bones lose minerals and strength with aging. This can result in bone fractures. If you are 65 years old or older, or if you are at risk for osteoporosis and fractures, ask your health care provider if you should:  Be screened for bone loss.  Take a calcium or vitamin D supplement to lower your risk of fractures.  Be given hormone replacement therapy (HRT) to treat symptoms of menopause. Follow these instructions at home: Lifestyle  Do not use any products that contain nicotine or  tobacco, such as cigarettes, e-cigarettes, and chewing tobacco. If you need help quitting, ask your health care provider.  Do not use street drugs.  Do not share needles.  Ask your health care provider for help if you need support or information about quitting drugs. Alcohol use  Do not drink alcohol if: ? Your health care provider tells you not to drink. ? You are pregnant, may be pregnant, or are planning to become pregnant.  If you drink alcohol: ? Limit how much you use to 0-1 drink a day. ? Limit intake if you are breastfeeding.  Be aware of how much alcohol is in your drink. In the [place] one drink equals one 12 oz bottle of beer (355 mL), one 5 oz glass of wine (148 mL), or one 1 oz glass of hard liquor (44 mL). General instructions  Schedule regular health, dental, and eye exams.  Stay current with your vaccines.  Tell your health care provider if: ? You often feel depressed. ? You have ever  been abused or do not feel safe at home. Summary  Adopting a healthy lifestyle and getting preventive care are important in promoting health and wellness.  Follow your health care provider's instructions about healthy diet, exercising, and getting tested or screened for diseases.  Follow your health care provider's instructions on monitoring your cholesterol and blood pressure. This information is not intended to replace advice given to you by your health care provider. Make sure you discuss any questions you have with your health care provider. Document Revised: 05/13/2018 Document Reviewed: 05/13/2018 Elsevier Patient Education  2020 [employer].  [place] Elbow Rehab Ask your health care provider which exercises are safe for you. Do exercises exactly as told by your health care provider and adjust them as directed. It is normal to feel mild stretching, pulling, tightness, or discomfort as you do these exercises. Stop right away if you feel sudden pain or your pain gets worse. Do  not begin these exercises until told by your health care provider. Stretching and range-of-motion exercises These exercises warm up your muscles and joints and improve the movement and flexibility of your elbow. These exercises also help to relieve pain, numbness, and tingling. Wrist flexion, assisted  1. Straighten your left / right elbow in front of you with your palm facing down toward the floor. ? If told by your health care provider, bend your left / right elbow to a 90-degree angle (right angle) at your side. 2. With your other hand, gently push over the back of your left / right hand so your fingers point toward the floor (flexion). Stop when you feel a gentle stretch on the back of your forearm. 3. Hold this position for __________ seconds. Repeat __________ times. Complete this exercise __________ times a day. Wrist extension, assisted  1. Straighten your left / right elbow in front of you with your palm facing up toward the ceiling. ? If told by your health care provider, bend your left / right elbow to a 90-degree angle (right angle) at your side. 2. With your other hand, gently pull your left / right hand and fingers toward the floor (extension). Stop when you feel a gentle stretch on the palm side of your forearm. 3. Hold this position for __________ seconds. Repeat __________ times. Complete this exercise __________ times a day. Assisted forearm rotation, supination 1. Sit or stand with your left / right elbow bent to a 90-degree angle (right angle) at your side. 2. Using your uninjured hand, turn (rotate) your left / right palm up toward the ceiling (supination) until you feel a gentle stretch along the inside of your forearm. 3. Hold this position for __________ seconds. Repeat __________ times. Complete this exercise __________ times a day. Assisted forearm rotation, pronation 1. Sit or stand with your left / right elbow bent to a 90-degree angle (right angle) at your  side. 2. Using your uninjured hand, rotate your left / right palm down toward the floor (pronation) until you feel a gentle stretch along the outside of your forearm. 3. Hold this position for __________ seconds. Repeat __________ times. Complete this exercise __________ times a day. Strengthening exercises These exercises build strength and endurance in your forearm and elbow. Endurance is the ability to use your muscles for a long time, even after they get tired. Radial deviation  1. Stand with a __________ weight or a hammer in your left / right hand. Or, sit while holding a rubber exercise band or tubing, with your left /  right forearm supported on a table or countertop. ? If you are standing, position your forearm so that your thumb is facing forward. If you are sitting, position your forearm so that the thumb is facing the ceiling. This is the neutral position. 2. Raise your hand upward in front of you so your thumb moves toward the ceiling (radial deviation), or pull up on the rubber tubing. Keep your forearm and elbow still while you move your wrist only. 3. Hold this position for __________ seconds. 4. Slowly return to the starting position. Repeat __________ times. Complete this exercise __________ times a day. Wrist extension, eccentric 1. Sit with your left / right forearm palm-down and supported on a table or other surface. Let your left / right wrist extend over the edge of the surface. 2. Hold a __________ weight or a piece of exercise band or tubing in your left / right hand. ? If using a rubber exercise band or tubing, hold the other end of the tubing with your other hand. 3. Use your uninjured hand to move your left / right hand up toward the ceiling. 4. Take your uninjured hand away and slowly return to the starting position using only your left / right hand. Lowering your arm under tension is called eccentric extension. Repeat __________ times. Complete this exercise  __________ times a day. Wrist extension Do not do this exercise if it causes pain at the outside of your elbow. Only do this exercise once instructed by your health care provider. 1. Sit with your left / right forearm supported on a table or other surface and your palm turned down toward the floor. Let your left / right wrist extend over the edge of the surface. 2. Hold a __________ weight or a piece of rubber exercise band or tubing. ? If you are using a rubber exercise band or tubing, hold the band or tubing in place with your other hand to provide resistance. 3. Slowly bend your wrist so your hand moves up toward the ceiling (extension). Move only your wrist, keeping your forearm and elbow still. 4. Hold this position for __________ seconds. 5. Slowly return to the starting position. Repeat __________ times. Complete this exercise __________ times a day. Forearm rotation, supination To do this exercise, you will need a lightweight hammer or rubber mallet. 1. Sit with your left / right forearm supported on a table or other surface. Bend your elbow to a 90-degree angle (right angle). Position your forearm so that your palm is facing down toward the floor, with your hand resting over the edge of the table. 2. Hold a hammer in your left / right hand. ? To make this exercise easier, hold the hammer near the head of the hammer. ? To make this exercise harder, hold the hammer near the end of the handle. 3. Without moving your wrist or elbow, slowly rotate your forearm so your palm faces up toward the ceiling (supination). 4. Hold this position for __________ seconds. 5. Slowly return to the starting position. Repeat __________ times. Complete this exercise __________ times a day. Shoulder blade squeeze 1. Sit in a stable chair or stand with good posture. If you are sitting down, do not let your back touch the back of the chair. 2. Your arms should be at your sides with your elbows bent to a  90-degree angle (right angle). Position your forearms so that your thumbs are facing the ceiling (neutral position). 3. Without lifting your shoulders up, squeeze your  shoulder blades tightly together. 4. Hold this position for __________ seconds. 5. Slowly release and return to the starting position. Repeat __________ times. Complete this exercise __________ times a day. This information is not intended to replace advice given to you by your health care provider. Make sure you discuss any questions you have with your health care provider. Document Revised: 09/10/2018 Document Reviewed: 07/14/2018 Elsevier Patient Education  2020 [employer].

## 2020-03-31 NOTE — Progress Notes (Signed)
BP 109/71   Pulse 75   Temp 98.2 F (36.8 C) (Oral)   Ht 5' 1.4" (1.56 m)   Wt 122 lb 6.4 oz (55.5 kg)   SpO2 96%   BMI 22.83 kg/m    Subjective:    Patient ID: Heidi Rodriguez, female    DOB: July 12, 1970, 49 y.o.   MRN: [id number]  HPI: Heidi Rodriguez is a 49 y.o. female presenting on 03/31/2020 for comprehensive medical examination. Current medical complaints include:  Had 3 episodes of stool incontinence last month. No warning, just a little bit of stool ended up on her underwear. She didn't remember any constipation around that time, but she usually only goes every 2-3 days. No blood. No diarrhea.  Has been having pain in her R elbow for about 8 months. Worse with movement and lifting. Better with brace and rest. Pain does not radiate. It's aching and sore. She is otherwise been doing well with no other concerns or complaints at this time.   Menopausal Symptoms: no  Depression Screen done today and results listed below:  [facility] 03/31/2020 03/26/2019 03/23/2018 10/20/2017  Decreased Interest 0 0 0 0  Down, Depressed, Hopeless 0 0 0 0  PHQ - 2 Score 0 0 0 0  Altered sleeping - 0 0 -  Tired, decreased energy - 0 0 -  Change in appetite - 0 0 -  Feeling bad or failure about yourself  - 0 0 -  Trouble concentrating - 0 0 -  Moving slowly or fidgety/restless - 0 0 -  Suicidal thoughts - 0 0 -  PHQ-9 Score - 0 0 -  Difficult doing work/chores - Not difficult at all - -    Past Medical History:  Past Medical History:  Diagnosis Date  . Abnormal cytology   . CIN I (cervical intraepithelial neoplasia I)   . GERD (gastroesophageal reflux disease)   . Herpes genitalis   . PONV (postoperative nausea and vomiting)   . Vulvar intraepithelial neoplasia (VIN) grade 1     Surgical History:  Past Surgical History:  Procedure Laterality Date  . ABDOMINAL HYSTERECTOMY    . COLPOSCOPY N/A 09/22/2015   Procedure: COLPOSCOPY;  Surgeon: Vena AustriaAndreas Staebler, MD;   Location: [facility] ORS;  Service: Gynecology;  Laterality: N/A;  . LESION REMOVAL N/A 09/22/2015   Procedure: EXCISION VAGINAL LESION;  Surgeon: Vena AustriaAndreas Staebler, MD;  Location: [facility] OR[facility];  Service: Gynecology;  Laterality: N/A;    Medications:  Current Outpatient Medications on File Prior to Visit  Medication Sig  . Biotin 10 MG CAPS Take by mouth.  . calcium carbonate 1250 MG capsule Take 1,250 mg by mouth daily.  . Cholecalciferol (VITAMIN D3) 2000 units capsule Take by mouth.  . Lactobacillus Acid-Pectin (ACIDOPHILUS/PECTIN) CAPS Take by mouth.  . Multiple Vitamin (MULTIVITAMIN) capsule Take 1 capsule by mouth daily.  . Multiple Vitamins-Minerals (ZINC PO) Take by mouth daily.  Marland Kitchen. NAFTIN 2 % GEL APPLY DAILY TO AFFECTED AREA(S) PLUS A 0.5 INCH MARGIN TO HEALTHY, SURROUNDING SKIN FOR 2 WEEKS  . ranitidine (ZANTAC) 75 MG tablet Take 75 mg by mouth daily as needed for heartburn.  . TURMERIC PO Take by mouth daily.  . vitamin C (ASCORBIC ACID) 500 MG tablet Take by mouth.   No current facility-administered medications on file prior to visit.    Allergies:  Allergies  Allergen Reactions  . Erythromycin Hives  . Keflex [Cephalexin] Hives  . Penicillin G     Other reaction(s):  Unknown    Social History:  Social History   Socioeconomic History  . Marital status: Married    Spouse name: Not on file  . Number of children: Not on file  . Years of education: Not on file  . Highest education level: Not on file  Occupational History  . Not on file  Tobacco Use  . Smoking status: Never Smoker  . Smokeless tobacco: Never Used  Vaping Use  . Vaping Use: Never used  Substance and Sexual Activity  . Alcohol use: No  . Drug use: No  . Sexual activity: Yes    Birth control/protection: Surgical    Comment: Hysterectomy  Other Topics Concern  . Not on file  Social History Narrative  . Not on file   Social Determinants of Health   Financial Resource Strain:   . Difficulty of Paying  Living Expenses: Not on file  Food Insecurity:   . Worried About Programme researcher, broadcasting/film/video in the Last Year: Not on file  . Ran Out of Food in the Last Year: Not on file  Transportation Needs:   . Lack of Transportation (Medical): Not on file  . Lack of Transportation (Non-Medical): Not on file  Physical Activity:   . Days of Exercise per Week: Not on file  . Minutes of Exercise per Session: Not on file  Stress:   . Feeling of Stress : Not on file  Social Connections:   . Frequency of Communication with Friends and Family: Not on file  . Frequency of Social Gatherings with Friends and Family: Not on file  . Attends Religious Services: Not on file  . Active Member of Clubs or Organizations: Not on file  . Attends Banker Meetings: Not on file  . Marital Status: Not on file  Intimate Partner Violence:   . Fear of Current or Ex-Partner: Not on file  . Emotionally Abused: Not on file  . Physically Abused: Not on file  . Sexually Abused: Not on file   Social History   Tobacco Use  Smoking Status Never Smoker  Smokeless Tobacco Never Used   Social History   Substance and Sexual Activity  Alcohol Use No    Family History:  Family History  Problem Relation Age of Onset  . Skin cancer Mother 4  . Glaucoma Mother   . Diabetes Maternal Uncle   . Skin cancer Maternal Grandmother   . Leukemia Maternal Grandmother   . CAD Maternal Grandmother   . Glaucoma Maternal Grandmother   . CAD Father   . Hyperlipidemia Father   . Heart disease Father   . Atrial fibrillation Father   . Obesity Sister   . Osteoarthritis Sister   . Asthma Sister   . Breast cancer Paternal Aunt 32  . Cancer Paternal Grandmother     Past medical history, surgical history, medications, allergies, family history and social history reviewed with patient today and changes made to appropriate areas of the chart.   Review of Systems  Constitutional: Negative.   HENT: Negative.   Eyes: Negative.    Respiratory: Negative.   Cardiovascular: Negative.   Gastrointestinal: Negative.   Genitourinary: Negative.   Musculoskeletal: Positive for joint pain. Negative for back pain, falls, myalgias and neck pain.  Skin: Negative.   Neurological: Negative.   Endo/Heme/Allergies: Negative for environmental allergies and polydipsia. Bruises/bleeds easily.  Psychiatric/Behavioral: Negative.     All other ROS negative except what is listed above and in the HPI.  Objective:    BP 109/71   Pulse 75   Temp 98.2 F (36.8 C) (Oral)   Ht 5' 1.4" (1.56 m)   Wt 122 lb 6.4 oz (55.5 kg)   SpO2 96%   BMI 22.83 kg/m   Wt Readings from Last 3 Encounters:  03/31/20 122 lb 6.4 oz (55.5 kg)  11/15/19 122 lb (55.3 kg)  03/26/19 119 lb (54 kg)    Physical Exam Vitals and nursing note reviewed.  Constitutional:      General: She is not in acute distress.    Appearance: Normal appearance. She is not ill-appearing, toxic-appearing or diaphoretic.  HENT:     Head: Normocephalic and atraumatic.     Right Ear: Tympanic membrane, ear canal and external ear normal. There is no impacted cerumen.     Left Ear: Tympanic membrane, ear canal and external ear normal. There is no impacted cerumen.     Nose: Nose normal. No congestion or rhinorrhea.     Mouth/Throat:     Mouth: Mucous membranes are moist.     Pharynx: Oropharynx is clear. No oropharyngeal exudate or posterior oropharyngeal erythema.  Eyes:     General: No scleral icterus.       Right eye: No discharge.        Left eye: No discharge.     Extraocular Movements: Extraocular movements intact.     Conjunctiva/sclera: Conjunctivae normal.     Pupils: Pupils are equal, round, and reactive to light.  Neck:     Vascular: No carotid bruit.  Cardiovascular:     Rate and Rhythm: Normal rate and regular rhythm.     Pulses: Normal pulses.     Heart sounds: No murmur heard.  No friction rub. No gallop.   Pulmonary:     Effort: Pulmonary effort  is normal. No respiratory distress.     Breath sounds: Normal breath sounds. No stridor. No wheezing, rhonchi or rales.  Chest:     Chest wall: No tenderness.  Abdominal:     General: Abdomen is flat. Bowel sounds are normal. There is no distension.     Palpations: Abdomen is soft. There is no mass.     Tenderness: There is no abdominal tenderness. There is no right CVA tenderness, left CVA tenderness, guarding or rebound.     Hernia: No hernia is present.  Genitourinary:    Comments: Breast and pelvic exams deferred with shared decision making Musculoskeletal:        General: No swelling, tenderness, deformity or signs of injury.     Cervical back: Normal range of motion and neck supple. No rigidity. No muscular tenderness.     Right lower leg: No edema.     Left lower leg: No edema.     Comments: Tenderness to R lateral epicondyle  Lymphadenopathy:     Cervical: No cervical adenopathy.  Skin:    General: Skin is warm and dry.     Capillary Refill: Capillary refill takes less than 2 seconds.     Coloration: Skin is not jaundiced or pale.     Findings: No bruising, erythema, lesion or rash.  Neurological:     General: No focal deficit present.     Mental Status: She is alert and oriented to person, place, and time. Mental status is at baseline.     Cranial Nerves: No cranial nerve deficit.     Sensory: No sensory deficit.     Motor: No weakness.  Coordination: Coordination normal.     Gait: Gait normal.     Deep Tendon Reflexes: Reflexes normal.  Psychiatric:        Mood and Affect: Mood normal.        Behavior: Behavior normal.        Thought Content: Thought content normal.        Judgment: Judgment normal.     Results for orders placed or performed in visit on 03/31/20  CBC with Differential/Platelet  Result Value Ref Range   WBC 6.5 3.4 - 10.8 x10E3/uL   RBC 4.25 3.77 - 5.28 x10E6/uL   Hemoglobin 14.6 11.1 - 15.9 g/dL   Hematocrit 86.7 67.2 - 46.6 %   MCV 100  (H) 79 - 97 fL   MCH 34.4 (H) 26.6 - 33.0 pg   MCHC 34.4 31 - 35 g/dL   RDW 09.4 70.9 - 62.8 %   Platelets 253 150 - 450 x10E3/uL   Neutrophils 63 Not Estab. %   Lymphs 23 Not Estab. %   Monocytes 6 Not Estab. %   Eos 7 Not Estab. %   Basos 1 Not Estab. %   Neutrophils Absolute 4.1 1.40 - 7.00 x10E3/uL   Lymphocytes Absolute 1.5 0 - 3 x10E3/uL   Monocytes Absolute 0.4 0 - 0 x10E3/uL   EOS (ABSOLUTE) 0.4 0.0 - 0.4 x10E3/uL   Basophils Absolute 0.1 0 - 0 x10E3/uL   Immature Granulocytes 0 Not Estab. %   Immature Grans (Abs) 0.0 0.0 - 0.1 x10E3/uL  Comprehensive metabolic panel  Result Value Ref Range   Glucose 82 65 - 99 mg/dL   BUN 16 6 - 24 mg/dL   Creatinine, Ser 3.66 0.57 - 1.00 mg/dL   GFR calc non Af Amer 78 >59 mL/min/1.73   GFR calc Af Amer 90 >59 mL/min/1.73   BUN/Creatinine Ratio 18 9 - 23   Sodium 141 134 - 144 mmol/L   Potassium 4.1 3.5 - 5.2 mmol/L   Chloride 103 96 - 106 mmol/L   CO2 24 20 - 29 mmol/L   Calcium 9.8 8.7 - 10.2 mg/dL   Total Protein 6.7 6.0 - 8.5 g/dL   Albumin 4.5 3.8 - 4.8 g/dL   Globulin, Total 2.2 1.5 - 4.5 g/dL   Albumin/Globulin Ratio 2.0 1.2 - 2.2   Bilirubin Total 0.9 0.0 - 1.2 mg/dL   Alkaline Phosphatase 86 44 - 121 IU/L   AST 18 0 - 40 IU/L   ALT 21 0 - 32 IU/L  Lipid Panel w/o Chol/HDL Ratio  Result Value Ref Range   Cholesterol, Total 212 (H) 100 - 199 mg/dL   Triglycerides 294 (H) 0 - 149 mg/dL   HDL 53 >76 mg/dL   VLDL Cholesterol Cal 32 5 - 40 mg/dL   LDL Chol Calc (NIH) 546 (H) 0 - 99 mg/dL  TSH  Result Value Ref Range   TSH 1.490 0.450 - 4.500 uIU/mL  Urinalysis, Routine w reflex microscopic  Result Value Ref Range   Specific Gravity, UA 1.010 1.005 - 1.030   pH, UA 7.0 5.0 - 7.5   Color, UA Yellow Yellow   Appearance Ur Clear Clear   Leukocytes,UA Negative Negative   Protein,UA Negative Negative/Trace   Glucose, UA Negative Negative   Ketones, UA Negative Negative   RBC, UA Negative Negative   Bilirubin, UA  Negative Negative   Urobilinogen, Ur 0.2 0.2 - 1.0 mg/dL   Nitrite, UA Negative Negative      Assessment &  Plan:   Problem List Items Addressed This Visit    None    Visit Diagnoses    Routine general medical examination at a health care facility    -  Primary   Vaccines up to date. Screening labs checked today. Pap up to date. Mammogram up to date. Colonoscopy ordered. Continue diet and exercise. Call with any concerns   Relevant Orders   CBC with Differential/Platelet (Completed)   Comprehensive metabolic panel (Completed)   Lipid Panel w/o Chol/HDL Ratio (Completed)   TSH (Completed)   Urinalysis, Routine w reflex microscopic (Completed)   Fecal smearing       Possibly due to extra fiber- due for colonoscopy. Referral generated today.    Relevant Orders   Ambulatory referral to Gastroenterology   Right lateral epicondylitis       Injection given today as below. Call with any concerns.    Screening for colon cancer       Referral to GI made today.   Relevant Orders   Ambulatory referral to Gastroenterology   Need for influenza vaccination       Flu shot given today.   Relevant Orders   Flu Vaccine QUAD 36+ mos IM (Completed)       Follow up plan: Return in about 1 year (around 03/31/2021) for physical.  Procedure: Right Lateral Epicondylitis Steroid Injection        Diagnosis:   ICD-10-CM   1. Routine general medical examination at a health care facility  Z00.00 CBC with Differential/Platelet    Comprehensive metabolic panel    Lipid Panel w/o Chol/HDL Ratio    TSH    Urinalysis, Routine w reflex microscopic   Vaccines up to date. Screening labs checked today. Pap up to date. Mammogram up to date. Colonoscopy ordered. Continue diet and exercise. Call with any concerns  2. Fecal smearing  R15.1 Ambulatory referral to Gastroenterology   Possibly due to extra fiber- due for colonoscopy. Referral generated today.   3. Right lateral epicondylitis  M77.11    Injection  given today as below. Call with any concerns.   4. Screening for colon cancer  Z12.11 Ambulatory referral to Gastroenterology   Referral to GI made today.  5. Need for influenza vaccination  Z23 Flu Vaccine QUAD 36+ mos IM   Flu shot given today.    Physician: MJ Consent:  Risks, benefits, and alternative treatments discussed and all questions were answered.  Patient elected to proceed and verbal consent obtained.  Description: Area prepped and draped using  semi-sterile technique.  After identifying area of maximal tenderness, A mixture of 1cc 1% lidocaine and 0.5cc Kenalog 40 was injected.  A bandage was then placed over the injection site. Complications: none Post Procedure Instructions: Wound care instructions discussed and patient was instructed to keep area clean and dry.  Signs and symptoms of infection discussed, patient agrees to contact the office ASAP should they occur.   LABORATORY TESTING:  - Pap smear: up to date  IMMUNIZATIONS:   - Tdap: Tetanus vaccination status reviewed: last tetanus booster within 10 years. - Influenza: Administered today - Pneumovax: Not applicable - COVID: Up to date  SCREENING: -Mammogram: Up to date  - Colonoscopy: Ordered today   PATIENT COUNSELING:   Advised to take 1 mg of folate supplement per day if capable of pregnancy.   Sexuality: Discussed sexually transmitted diseases, partner selection, use of condoms, avoidance of unintended pregnancy  and contraceptive alternatives.   Advised to avoid cigarette smoking.  I discussed with the patient that most people either abstain from alcohol or drink within safe limits (<=14/week and <=4 drinks/occasion for males, <=7/weeks and <= 3 drinks/occasion for females) and that the risk for alcohol disorders and other health effects rises proportionally with the number of drinks per week and how often a drinker exceeds daily limits.  Discussed cessation/primary prevention of drug use and availability  of treatment for abuse.   Diet: Encouraged to adjust caloric intake to maintain  or achieve ideal body weight, to reduce intake of dietary saturated fat and total fat, to limit sodium intake by avoiding high sodium foods and not adding table salt, and to maintain adequate dietary potassium and calcium preferably from fresh fruits, vegetables, and low-fat dairy products.    stressed the importance of regular exercise  Injury prevention: Discussed safety belts, safety helmets, smoke detector, smoking near bedding or upholstery.   Dental health: Discussed importance of regular tooth brushing, flossing, and dental visits.    NEXT PREVENTATIVE PHYSICAL DUE IN 1 YEAR. Return in about 1 year (around 03/31/2021) for physical.

## 2020-04-01 LAB — CBC WITH DIFFERENTIAL/PLATELET
Basophils Absolute: 0.1 10*3/uL (ref 0.0–0.2)
Basos: 1 %
EOS (ABSOLUTE): 0.4 10*3/uL (ref 0.0–0.4)
Eos: 7 %
Hematocrit: 42.4 % (ref 34.0–46.6)
Hemoglobin: 14.6 g/dL (ref 11.1–15.9)
Immature Grans (Abs): 0 10*3/uL (ref 0.0–0.1)
Immature Granulocytes: 0 %
Lymphocytes Absolute: 1.5 10*3/uL (ref 0.7–3.1)
Lymphs: 23 %
MCH: 34.4 pg — ABNORMAL HIGH (ref 26.6–33.0)
MCHC: 34.4 g/dL (ref 31.5–35.7)
MCV: 100 fL — ABNORMAL HIGH (ref 79–97)
Monocytes Absolute: 0.4 10*3/uL (ref 0.1–0.9)
Monocytes: 6 %
Neutrophils Absolute: 4.1 10*3/uL (ref 1.4–7.0)
Neutrophils: 63 %
Platelets: 253 10*3/uL (ref 150–450)
RBC: 4.25 x10E6/uL (ref 3.77–5.28)
RDW: 12.2 % (ref 11.7–15.4)
WBC: 6.5 10*3/uL (ref 3.4–10.8)

## 2020-04-01 LAB — COMPREHENSIVE METABOLIC PANEL
ALT: 21 IU/L (ref 0–32)
AST: 18 IU/L (ref 0–40)
Albumin/Globulin Ratio: 2 (ref 1.2–2.2)
Albumin: 4.5 g/dL (ref 3.8–4.8)
Alkaline Phosphatase: 86 IU/L (ref 44–121)
BUN/Creatinine Ratio: 18 (ref 9–23)
BUN: 16 mg/dL (ref 6–24)
Bilirubin Total: 0.9 mg/dL (ref 0.0–1.2)
CO2: 24 mmol/L (ref 20–29)
Calcium: 9.8 mg/dL (ref 8.7–10.2)
Chloride: 103 mmol/L (ref 96–106)
Creatinine, Ser: 0.89 mg/dL (ref 0.57–1.00)
GFR calc Af Amer: 90 mL/min/{1.73_m2} (ref >59)
GFR calc non Af Amer: 78 mL/min/{1.73_m2} (ref >59)
Globulin, Total: 2.2 g/dL (ref 1.5–4.5)
Glucose: 82 mg/dL (ref 65–99)
Potassium: 4.1 mmol/L (ref 3.5–5.2)
Sodium: 141 mmol/L (ref 134–144)
Total Protein: 6.7 g/dL (ref 6.0–8.5)

## 2020-04-01 LAB — LIPID PANEL W/O CHOL/HDL RATIO
Cholesterol, Total: 212 mg/dL — ABNORMAL HIGH (ref 100–199)
HDL: 53 mg/dL (ref >39)
LDL Chol Calc (NIH): 127 mg/dL — ABNORMAL HIGH (ref 0–99)
Triglycerides: 180 mg/dL — ABNORMAL HIGH (ref 0–149)
VLDL Cholesterol Cal: 32 mg/dL (ref 5–40)

## 2020-04-01 LAB — TSH: TSH: 1.49 u[IU]/mL (ref 0.450–4.500)

## 2020-05-15 ENCOUNTER — Other Ambulatory Visit: Payer: Self-pay

## 2020-05-15 ENCOUNTER — Encounter: Payer: Self-pay | Admitting: Gastroenterology

## 2020-05-15 ENCOUNTER — Ambulatory Visit: Payer: Commercial Managed Care - PPO | Admitting: Gastroenterology

## 2020-05-15 VITALS — BP 142/100 | HR 88 | Temp 97.8°F | Ht 62.0 in | Wt 121.4 lb

## 2020-05-15 DIAGNOSIS — R159 Full incontinence of feces: Secondary | ICD-10-CM | POA: Diagnosis not present

## 2020-05-15 DIAGNOSIS — Z1211 Encounter for screening for malignant neoplasm of colon: Secondary | ICD-10-CM

## 2020-05-15 MED ORDER — NA SULFATE-K SULFATE-MG SULF 17.5-3.13-1.6 GM/177ML PO SOLN: ORAL | 0 refills | Status: DC

## 2020-05-15 NOTE — Progress Notes (Signed)
Heidi Rodriguez [address]  [address]  [place], [place] [address]  Main: [phone]  Fax: [phone]   Gastroenterology Consultation  Referring Provider:     Dorcas Carrow, DO Primary Care Physician:  Dorcas Carrow, DO Reason for Consultation:     CRC screening, fecal smearing        HPI:    Chief Complaint  Patient presents with  . Colon Cancer Screening    Heidi Rodriguez is a 49 y.o. y/o female referred for consultation & management  by Dr. Laural Benes, Megan P, DO.  Patient is an otherwise healthy female who states she is due for screening colonoscopy, but her PCP recommended that she be seen in GI clinic prior to her procedure as she states she does not tolerate too much liquid at 1 time and did not think she would be able to complete a gallon prep, but does think that she can tolerate a low volume prep like her husband had when he had his screening colonoscopy about a month ago.  The other reason also stated the referral is fecal smearing.  Patient attributes the symptoms to increased intake of flaxseed and psyllium husk.  Since she has decreased this, her symptoms have improved.  No urinary incontinence.  Fecal incontinence started about 2 to 3 months ago very intermittently when she would take increased doses of flaxseed.  No blood in her stool.  No family history of colon cancer.  No prior EGD or colonoscopy.  The patient denies abdominal or flank pain, anorexia, nausea or vomiting, dysphagia, change in bowel habits or black or bloody stools or weight loss.  Patient states the reason she takes psyllium husk is because she otherwise gets constipated.  She reports history of vaginal delivery x1 when she was 49 years old.  States suction had to be used and she had an episiotomy.  She did go to pelvic floor physical therapy and that helped her symptoms as well.  Past Medical History:  Diagnosis Date  . Abnormal cytology   . CIN I (cervical intraepithelial neoplasia  I)   . GERD (gastroesophageal reflux disease)   . Herpes genitalis   . PONV (postoperative nausea and vomiting)   . Vulvar intraepithelial neoplasia (VIN) grade 1     Past Surgical History:  Procedure Laterality Date  . ABDOMINAL HYSTERECTOMY    . COLPOSCOPY N/A 09/22/2015   Procedure: COLPOSCOPY;  Surgeon: Vena Austria, MD;  Location: [facility] ORS;  Service: Gynecology;  Laterality: N/A;  . LESION REMOVAL N/A 09/22/2015   Procedure: EXCISION VAGINAL LESION;  Surgeon: Vena Austria, MD;  Location: [facility] ORS;  Service: Gynecology;  Laterality: N/A;    Prior to Admission medications   Medication Sig Start Date End Date Taking? Authorizing Provider  azelastine (ASTELIN) 0.1 % nasal spray Place 2 sprays into both nostrils 2 (two) times daily. Use in each nostril as directed 03/31/20  Yes Johnson, Megan P, DO  Biotin 10 MG CAPS Take by mouth.   Yes [provider]  calcium carbonate 1250 MG capsule Take 1,250 mg by mouth daily.   Yes [provider]  Cholecalciferol (VITAMIN D3) 2000 units capsule Take by mouth.   Yes [provider]  estradiol (ESTRACE VAGINAL) 0.1 MG/GM vaginal cream 1 gram vaginally twice weekly 03/31/20  Yes Johnson, Megan P, DO  Lactobacillus Acid-Pectin (ACIDOPHILUS/PECTIN) CAPS Take by mouth.   Yes [provider]  Multiple Vitamin (MULTIVITAMIN) capsule Take 1 capsule by mouth daily.  Yes [provider]  Multiple Vitamins-Minerals (ZINC PO) Take by mouth daily.   Yes [provider]  NAFTIN 2 % GEL APPLY DAILY TO AFFECTED AREA(S) PLUS A 0.5 INCH MARGIN TO HEALTHY, SURROUNDING SKIN FOR 2 WEEKS 12/12/16  Yes Copland, Alicia B, PA-C  spironolactone (ALDACTONE) 50 MG tablet 2(TWO) TABLET(S) ORAL EVERY DAY 03/31/20  Yes Johnson, Megan P, DO  TURMERIC PO Take by mouth daily.   Yes [provider]  valACYclovir (VALTREX) 500 MG tablet Take 1 tablet (500 mg total) by mouth daily. 03/31/20  Yes Johnson, Megan P,  DO  vitamin C (ASCORBIC ACID) 500 MG tablet Take by mouth.   Yes [provider]  Na Sulfate-K Sulfate-Mg Sulf 17.5-3.13-1.6 GM/177ML SOLN At 5 PM the day before procedure take 1 bottle and 5 hours before procedure take 1 bottle. 05/15/20   Pasty Spillers, MD  ranitidine (ZANTAC) 75 MG tablet Take 75 mg by mouth daily as needed for heartburn. Patient not taking: Reported on 05/15/2020    [provider]    Family History  Problem Relation Age of Onset  . Skin cancer Mother 51  . Glaucoma Mother   . Diabetes Maternal Uncle   . Skin cancer Maternal Grandmother   . Leukemia Maternal Grandmother   . CAD Maternal Grandmother   . Glaucoma Maternal Grandmother   . CAD Father   . Hyperlipidemia Father   . Heart disease Father   . Atrial fibrillation Father   . Obesity Sister   . Osteoarthritis Sister   . Asthma Sister   . Breast cancer Paternal Aunt 42  . Cancer Paternal Grandmother      Social History   Tobacco Use  . Smoking status: Never Smoker  . Smokeless tobacco: Never Used  Vaping Use  . Vaping Use: Never used  Substance Use Topics  . Alcohol use: No  . Drug use: No    Allergies as of 05/15/2020 - Review Complete 05/15/2020  Allergen Reaction Noted  . Erythromycin Hives 09/15/2015  . Keflex [cephalexin] Hives 09/15/2015  . Penicillin g  11/16/2015    Review of Systems:    All systems reviewed and negative except where noted in HPI.   Physical Exam:  BP (!) 142/100   Pulse 88   Temp 97.8 F (36.6 C) (Oral)   Ht 5\' 2"  (1.575 m)   Wt 121 lb 6.4 oz (55.1 kg)   BMI 22.20 kg/m  No LMP recorded. Patient has had a hysterectomy. Psych:  Alert and cooperative. Normal mood and affect. General:   Alert,  Well-developed, well-nourished, pleasant and cooperative in NAD Head:  Normocephalic and atraumatic. Eyes:  Sclera clear, no icterus.   Conjunctiva pink. Ears:  Normal auditory acuity. Nose:  No deformity, discharge, or lesions. Mouth:  No  deformity or lesions,oropharynx pink & moist. Neck:  Supple; no masses or thyromegaly. Abdomen:  Normal bowel sounds.  No bruits.  Soft, non-tender and non-distended without masses, hepatosplenomegaly or hernias noted.  No guarding or rebound tenderness.    Msk:  Symmetrical without gross deformities. Good, equal movement & strength bilaterally. Pulses:  Normal pulses noted. Extremities:  No clubbing or edema.  No cyanosis. Neurologic:  Alert and oriented x3;  grossly normal neurologically. Skin:  Intact without significant lesions or rashes. No jaundice. Lymph Nodes:  No significant cervical adenopathy. Psych:  Alert and cooperative. Normal mood and affect.   Labs: CBC    Component Value Date/Time   WBC 6.5 03/31/2020 0848  RBC 4.25 03/31/2020 0848   HGB 14.6 03/31/2020 0848   HCT 42.4 03/31/2020 0848   PLT 253 03/31/2020 0848   MCV 100 (H) 03/31/2020 0848   MCH 34.4 (H) 03/31/2020 0848   MCHC 34.4 03/31/2020 0848   RDW 12.2 03/31/2020 0848   LYMPHSABS 1.5 03/31/2020 0848   EOSABS 0.4 03/31/2020 0848   BASOSABS 0.1 03/31/2020 0848   CMP     Component Value Date/Time   NA 141 03/31/2020 0848   K 4.1 03/31/2020 0848   CL 103 03/31/2020 0848   CO2 24 03/31/2020 0848   GLUCOSE 82 03/31/2020 0848   BUN 16 03/31/2020 0848   CREATININE 0.89 03/31/2020 0848   CALCIUM 9.8 03/31/2020 0848   PROT 6.7 03/31/2020 0848   ALBUMIN 4.5 03/31/2020 0848   AST 18 03/31/2020 0848   ALT 21 03/31/2020 0848   ALKPHOS 86 03/31/2020 0848   BILITOT 0.9 03/31/2020 0848   GFRNONAA 78 03/31/2020 0848   GFRAA 90 03/31/2020 0848    Imaging Studies: No results found.  Assessment and Plan:   Felipe Cabell is a 49 y.o. y/o female has been referred for fecal smearing  Fecal incontinence or smearing only occur when she uses increased amount of flaxseed or psyllium husk.  This has improved since she has decreased the amount that she is taking  She has no alarm symptoms present and no urinary  incontinence.  Pelvic floor physical therapy has helped and she has previous history of vaginal delivery that required suctioning as well and symptoms are likely from both her flaxseed use and some component of pelvic floor dysfunction  She is due for screening colonoscopy and it would allow [place] to evaluate and rule out any underlying lesions as well  No indication for upper endoscopy  She does not think she will be able to tolerate a gallon prep, but may be able to tolerate a lower volume prep.  We will try to see if SuTabs is covered with her insurance and if not, other options would include other low volume prep such as Suprep, Clenpiq, Plenvu and others.  Just not GoLYTELY given that it is a gallon    Dr Heidi Rodriguez  Speech recognition software was used to dictate the above note.

## 2020-05-15 NOTE — Addendum Note (Signed)
Addended by: Adela Ports on: 05/15/2020 03:47 PM   Modules accepted: Orders

## 2020-05-23 ENCOUNTER — Other Ambulatory Visit: Payer: Self-pay

## 2020-05-23 ENCOUNTER — Other Ambulatory Visit
Admission: RE | Admit: 2020-05-23 | Discharge: 2020-05-23 | Disposition: A | Discharge status: Home / Self Care | Payer: Commercial Managed Care - PPO | Source: Ambulatory Visit | Attending: Gastroenterology | Admitting: Gastroenterology

## 2020-05-23 DIAGNOSIS — Z20822 Contact with and (suspected) exposure to covid-19: Secondary | ICD-10-CM | POA: Insufficient documentation

## 2020-05-23 DIAGNOSIS — Z01812 Encounter for preprocedural laboratory examination: Secondary | ICD-10-CM | POA: Diagnosis present

## 2020-05-23 LAB — SARS CORONAVIRUS 2 (TAT 6-24 HRS): SARS Coronavirus 2: NEGATIVE

## 2020-05-24 ENCOUNTER — Encounter: Payer: Self-pay | Admitting: Gastroenterology

## 2020-05-25 ENCOUNTER — Ambulatory Visit: Payer: Commercial Managed Care - PPO | Admitting: Anesthesiology

## 2020-05-25 ENCOUNTER — Encounter: Payer: Self-pay | Admitting: Gastroenterology

## 2020-05-25 ENCOUNTER — Encounter
Admission: RE | Disposition: A | Discharge status: Home / Self Care | Payer: Self-pay | Source: Home / Self Care | Attending: Gastroenterology

## 2020-05-25 ENCOUNTER — Ambulatory Visit
Admission: RE | Admit: 2020-05-25 | Discharge: 2020-05-25 | Disposition: A | Discharge status: Home / Self Care | Payer: Commercial Managed Care - PPO | Attending: Gastroenterology | Admitting: Gastroenterology

## 2020-05-25 ENCOUNTER — Other Ambulatory Visit: Payer: Self-pay

## 2020-05-25 DIAGNOSIS — Z881 Allergy status to other antibiotic agents status: Secondary | ICD-10-CM | POA: Insufficient documentation

## 2020-05-25 DIAGNOSIS — Z880 Allergy status to penicillin: Secondary | ICD-10-CM | POA: Diagnosis not present

## 2020-05-25 DIAGNOSIS — Z1211 Encounter for screening for malignant neoplasm of colon: Secondary | ICD-10-CM

## 2020-05-25 DIAGNOSIS — K6389 Other specified diseases of intestine: Secondary | ICD-10-CM | POA: Diagnosis not present

## 2020-05-25 HISTORY — PX: COLONOSCOPY WITH PROPOFOL: SHX5780

## 2020-05-25 SURGERY — COLONOSCOPY WITH PROPOFOL
Anesthesia: General

## 2020-05-25 MED ORDER — LIDOCAINE HCL (PF) 1 % IJ SOLN
INTRAMUSCULAR | Status: AC
  Administered 2020-05-25: 09:00:00 2 mL
  Filled 2020-05-25: qty 2

## 2020-05-25 MED ORDER — PROPOFOL 500 MG/50ML IV EMUL
INTRAVENOUS | Status: AC
  Filled 2020-05-25: qty 50

## 2020-05-25 MED ORDER — ONDANSETRON HCL 4 MG/2ML IJ SOLN
INTRAMUSCULAR | Status: DC | PRN
  Administered 2020-05-25: 4 mg via INTRAVENOUS

## 2020-05-25 MED ORDER — SODIUM CHLORIDE 0.9 % IV SOLN
INTRAVENOUS | Status: DC
  Administered 2020-05-25: 09:00:00 1000 mL via INTRAVENOUS

## 2020-05-25 MED ORDER — LIDOCAINE HCL (CARDIAC) PF 100 MG/5ML IV SOSY
PREFILLED_SYRINGE | INTRAVENOUS | Status: DC | PRN
  Administered 2020-05-25: 40 mg via INTRAVENOUS

## 2020-05-25 MED ORDER — ONDANSETRON HCL 4 MG/2ML IJ SOLN
INTRAMUSCULAR | Status: AC
  Filled 2020-05-25: qty 2

## 2020-05-25 MED ORDER — LIDOCAINE HCL (PF) 2 % IJ SOLN
INTRAMUSCULAR | Status: AC
  Filled 2020-05-25: qty 5

## 2020-05-25 MED ORDER — PROPOFOL 500 MG/50ML IV EMUL
INTRAVENOUS | Status: DC | PRN
  Administered 2020-05-25: 120 ug/kg/min via INTRAVENOUS

## 2020-05-25 NOTE — Transfer of Care (Signed)
Immediate Anesthesia Transfer of Care Note  Patient: Heidi Rodriguez  Procedure(s) Performed: COLONOSCOPY WITH PROPOFOL (N/A )  Patient Location: PACU  Anesthesia Type:General  Level of Consciousness: awake and sedated  Airway & Oxygen Therapy: Patient Spontanous Breathing and Patient connected to nasal cannula oxygen  Post-op Assessment: Report given to RN and Post -op Vital signs reviewed and stable  Post vital signs: Reviewed and stable  Last Vitals:  Vitals Value Taken Time  BP 95/46 05/25/20 1017  Temp 36.5 C 05/25/20 1016  Pulse 70 05/25/20 1017  Resp 21 05/25/20 1017  SpO2 99 % 05/25/20 1017  Vitals shown include unvalidated device data.  Last Pain:  Vitals:   05/25/20 0843  TempSrc: Temporal  PainSc: 0-No pain      Patients Stated Pain Goal: 0 (05/25/20 0843)  Complications: No complications documented.

## 2020-05-25 NOTE — H&P (Signed)
Melodie Bouillon, MD [address], [address], [place], [place], [address] [address], [address], [place], [place], [address] Phone: [phone]  Fax: [phone]  Primary Care Physician:  Dorcas Carrow, DO   Pre-Procedure History & Physical: HPI:  Heidi Rodriguez is a 49 y.o. female is here for a colonoscopy.   Past Medical History:  Diagnosis Date  . Abnormal cytology   . CIN I (cervical intraepithelial neoplasia I)   . GERD (gastroesophageal reflux disease)   . Herpes genitalis   . PONV (postoperative nausea and vomiting)   . Vulvar intraepithelial neoplasia (VIN) grade 1     Past Surgical History:  Procedure Laterality Date  . ABDOMINAL HYSTERECTOMY    . COLPOSCOPY N/A 09/22/2015   Procedure: COLPOSCOPY;  Surgeon: Vena Austria, MD;  Location: [facility] ORS;  Service: Gynecology;  Laterality: N/A;  . EYE SURGERY    . LESION REMOVAL N/A 09/22/2015   Procedure: EXCISION VAGINAL LESION;  Surgeon: Vena Austria, MD;  Location: [facility] ORS;  Service: Gynecology;  Laterality: N/A;    Prior to Admission medications   Medication Sig Start Date End Date Taking? Authorizing Provider  azelastine (ASTELIN) 0.1 % nasal spray Place 2 sprays into both nostrils 2 (two) times daily. Use in each nostril as directed 03/31/20  Yes Johnson, Megan P, DO  Biotin 10 MG CAPS Take by mouth.   Yes [provider]  calcium carbonate 1250 MG capsule Take 1,250 mg by mouth daily.   Yes [provider]  Cholecalciferol (VITAMIN D3) 2000 units capsule Take by mouth.   Yes [provider]  estradiol (ESTRACE VAGINAL) 0.1 MG/GM vaginal cream 1 gram vaginally twice weekly 03/31/20  Yes Johnson, Megan P, DO  Lactobacillus Acid-Pectin (ACIDOPHILUS/PECTIN) CAPS Take by mouth.   Yes [provider]  Multiple Vitamin (MULTIVITAMIN) capsule Take 1 capsule by mouth daily.   Yes [provider]  Multiple Vitamins-Minerals (ZINC PO) Take by mouth daily.   Yes [provider]  Na Sulfate-K Sulfate-Mg Sulf 17.5-3.13-1.6 GM/177ML SOLN At 5 PM the day before procedure take 1 bottle and 5 hours before procedure take 1 bottle. 05/15/20  Yes Adlean Hardeman, Michel Bickers B, MD  NAFTIN 2 % GEL APPLY DAILY TO AFFECTED AREA(S) PLUS A 0.5 INCH MARGIN TO HEALTHY, SURROUNDING SKIN FOR 2 WEEKS 12/12/16  Yes Copland, Alicia B, PA-C  ranitidine (ZANTAC) 75 MG tablet Take 75 mg by mouth daily as needed for heartburn.   Yes [provider]  spironolactone (ALDACTONE) 50 MG tablet 2(TWO) TABLET(S) ORAL EVERY DAY 03/31/20  Yes Johnson, Megan P, DO  TURMERIC PO Take by mouth daily.   Yes [provider]  valACYclovir (VALTREX) 500 MG tablet Take 1 tablet (500 mg total) by mouth daily. 03/31/20  Yes Johnson, Megan P, DO  vitamin C (ASCORBIC ACID) 500 MG tablet Take by mouth.   Yes [provider]    Allergies as of 05/15/2020 - Review Complete 05/15/2020  Allergen Reaction Noted  . Erythromycin Hives 09/15/2015  . Keflex [cephalexin] Hives 09/15/2015  . Penicillin g  11/16/2015    Family History  Problem Relation Age of Onset  . Skin cancer Mother 47  . Glaucoma Mother   . Diabetes Maternal Uncle   . Skin cancer Maternal Grandmother   . Leukemia Maternal Grandmother   . CAD Maternal Grandmother   . Glaucoma Maternal Grandmother   . CAD Father   . Hyperlipidemia Father   . Heart disease Father   . Atrial fibrillation  Father   . Obesity Sister   . Osteoarthritis Sister   . Asthma Sister   . Breast cancer Paternal Aunt 68  . Cancer Paternal Grandmother     Social History   Socioeconomic History  . Marital status: Married    Spouse name: Not on file  . Number of children: Not on file  . Years of education: Not on file  . Highest education level: Not on file  Occupational History  . Not on file  Tobacco Use  . Smoking status: Never Smoker  . Smokeless tobacco: Never Used  Vaping Use  . Vaping Use: Never used  Substance and Sexual  Activity  . Alcohol use: No  . Drug use: No  . Sexual activity: Yes    Birth control/protection: Surgical    Comment: Hysterectomy  Other Topics Concern  . Not on file  Social History Narrative  . Not on file   Social Determinants of Health   Financial Resource Strain: Not on file  Food Insecurity: Not on file  Transportation Needs: Not on file  Physical Activity: Not on file  Stress: Not on file  Social Connections: Not on file  Intimate Partner Violence: Not on file    Review of Systems: See HPI, otherwise negative ROS  Physical Exam: BP 126/89   Pulse 79   Temp (!) 96.3 F (35.7 C) (Temporal)   Resp 20   Ht 5\' 2"  (1.575 m)   Wt 55.1 kg   LMP  (LMP Unknown)   SpO2 100%   BMI 22.20 kg/m  General:   Alert,  pleasant and cooperative in NAD Head:  Normocephalic and atraumatic. Neck:  Supple; no masses or thyromegaly. Lungs:  Clear throughout to auscultation, normal respiratory effort.    Heart:  +S1, +S2, Regular rate and rhythm, No edema. Abdomen:  Soft, nontender and nondistended. Normal bowel sounds, without guarding, and without rebound.   Neurologic:  Alert and  oriented x4;  grossly normal neurologically.  Impression/Plan: Heidi Rodriguez is here for a colonoscopy to be performed for average risk screening.  Risks, benefits, limitations, and alternatives regarding  colonoscopy have been reviewed with the patient.  Questions have been answered.  All parties agreeable.   [name], MD  05/25/2020, 9:27 AM

## 2020-05-25 NOTE — Anesthesia Postprocedure Evaluation (Signed)
Anesthesia Post Note  Patient: Almeta Geisel  Procedure(s) Performed: COLONOSCOPY WITH PROPOFOL (N/A )  Patient location during evaluation: Phase II Anesthesia Type: General Level of consciousness: awake and alert, awake and oriented Pain management: pain level controlled Vital Signs Assessment: post-procedure vital signs reviewed and stable Respiratory status: spontaneous breathing, nonlabored ventilation and respiratory function stable Cardiovascular status: blood pressure returned to baseline Postop Assessment: no apparent nausea or vomiting Anesthetic complications: no   No complications documented.   Last Vitals:  Vitals:   05/25/20 1026 05/25/20 1036  BP: 107/70 99/69  Pulse: 60 (!) 53  Resp: (!) 21 (!) 23  Temp:    SpO2: 100% 100%    Last Pain:  Vitals:   05/25/20 1026  TempSrc:   PainSc: 0-No pain                 Manfred Arch

## 2020-05-25 NOTE — Op Note (Signed)
[facility] Gastroenterology Patient Name: Heidi KlinefelterRebecca Rodriguez Procedure Date: 05/25/2020 9:31 AM MRN: [id number] Account #: [id number] Date of Birth: Apr 24, 1971 Admit Type: Outpatient Age: 49 [facility] ROOM 2 Gender: Female Note Status: Finalized Procedure:             Colonoscopy Indications:           Screening for colorectal malignant neoplasm Providers:             Velecia Ovitt B. Maximino Greenlandahiliani MD, MD Referring MD:          Dorcas CarrowMegan P. Johnson (Referring MD) Medicines:             Monitored Anesthesia Care Complications:         No immediate complications. Procedure:             Pre-Anesthesia Assessment:                        - Prior to the procedure, a History and Physical was                         performed, and patient medications, allergies and                         sensitivities were reviewed. The patient's tolerance                         of previous anesthesia was reviewed.                        - The risks and benefits of the procedure and the                         sedation options and risks were discussed with the                         patient. All questions were answered and informed                         consent was obtained.                        - Patient identification and proposed procedure were                         verified prior to the procedure by the physician, the                         nurse, the anesthetist and the technician. The                         procedure was verified in the pre-procedure area in                         the procedure room in the endoscopy suite.                        - ASA Grade Assessment: II - A patient with mild  systemic disease.                        - After reviewing the risks and benefits, the patient                         was deemed in satisfactory condition to undergo the                         procedure.                        After obtaining informed consent, the  colonoscope was                         passed under direct vision. Throughout the procedure,                         the patient's blood pressure, pulse, and oxygen                         saturations were monitored continuously. The                         Colonoscope was introduced through the anus and                         advanced to the the cecum, identified by appendiceal                         orifice and ileocecal valve. The colonoscopy was                         performed with ease. The patient tolerated the                         procedure well. The quality of the bowel preparation                         was good. Findings:      The perianal and digital rectal examinations were normal.      The rectum, sigmoid colon, descending colon, transverse colon, ascending       colon and cecum appeared normal. Biopsies for histology were taken with       a cold forceps from the entire colon for evaluation of microscopic       colitis.      The retroflexed view of the distal rectum and anal verge was normal and       showed no anal or rectal abnormalities. Impression:            - The rectum, sigmoid colon, descending colon,                         transverse colon, ascending colon and cecum are                         normal. Biopsied.                        - The distal rectum and anal  verge are normal on                         retroflexion view. Recommendation:        - Discharge patient to home.                        - Resume previous diet.                        - Continue present medications.                        - Repeat colonoscopy in 10 years for screening                         purposes.                        - Return to primary care physician as previously                         scheduled.                        - The findings and recommendations were discussed with                         the patient.                        - The findings and recommendations  were discussed with                         the patient's family.                        - In the future, if patient develops new symptoms such                         as blood per rectum, abdominal pain, weight loss,                         altered bowel habits or any other reason for concern,                         patient should discuss this with thier PCP as they may                         need a GI referral at that time or evaluation for need                         for colonoscopy earlier than the recommended screening                         colonoscopy.                        In addition, if patient's family history of colon  cancer changes (no family history at this time) in the                         future, earlier screening may be indicated and patient                         should discuss this with PCP as well. Procedure Code(s):     --- Professional ---                        [phone], Colonoscopy, flexible; with biopsy, single or                         multiple Diagnosis Code(s):     --- Professional ---                        Z12.11, Encounter for screening for malignant neoplasm                         of colon CPT copyright 2019 American Medical Association. All rights reserved. The codes documented in this report are preliminary and upon coder review may  be revised to meet current compliance requirements.  Melodie Bouillon, MD Michel Bickers B. Maximino Greenland MD, MD 05/25/2020 10:16:04 AM This report has been signed electronically. Number of Addenda: 0 Note Initiated On: 05/25/2020 9:31 AM Scope Withdrawal Time: 0 hours 13 minutes 48 seconds  Total Procedure Duration: 0 hours 29 minutes 13 seconds  Estimated Blood Loss:  Estimated blood loss: none.      [facility]

## 2020-05-25 NOTE — Anesthesia Procedure Notes (Signed)
Performed by: Cook-Martin, Lissa Rowles Pre-anesthesia Checklist: Patient identified, Emergency Drugs available, Suction available, Patient being monitored and Timeout performed Patient Re-evaluated:Patient Re-evaluated prior to induction Oxygen Delivery Method: Nasal cannula Preoxygenation: Pre-oxygenation with 100% oxygen Induction Type: IV induction Placement Confirmation: positive ETCO2 and CO2 detector       

## 2020-05-25 NOTE — Anesthesia Preprocedure Evaluation (Addendum)
Anesthesia Evaluation  Patient identified by MRN, date of birth, ID band Patient awake    Reviewed: Allergy & Precautions, H&P , NPO status , Patient's Chart, lab work & pertinent test results  History of Anesthesia Complications (+) PONV  Airway Mallampati: II  TM Distance: >3 FB Neck ROM: Full    Dental no notable dental hx.    Pulmonary neg pulmonary ROS,    Pulmonary exam normal        Cardiovascular Exercise Tolerance: Good negative cardio ROS Normal cardiovascular exam     Neuro/Psych negative neurological ROS  negative psych ROS   GI/Hepatic Neg liver ROS, GERD  Controlled,  Endo/Other  negative endocrine ROS  Renal/GU negative Renal ROS  negative genitourinary   Musculoskeletal negative musculoskeletal ROS (+)   Abdominal   Peds negative pediatric ROS (+)  Hematology negative hematology ROS (+)   Anesthesia Other Findings Colon Screening  Reproductive/Obstetrics negative OB ROS                             Anesthesia Physical Anesthesia Plan  ASA: I  Anesthesia Plan: General   Post-op Pain Management:    Induction: Intravenous  PONV Risk Score and Plan: Propofol infusion and TIVA  Airway Management Planned: Nasal Cannula  Additional Equipment:   Intra-op Plan:   Post-operative Plan:   Informed Consent: I have reviewed the patients History and Physical, chart, labs and discussed the procedure including the risks, benefits and alternatives for the proposed anesthesia with the patient or authorized representative who has indicated his/her understanding and acceptance.       Plan Discussed with: CRNA, Anesthesiologist and Surgeon  Anesthesia Plan Comments:        Anesthesia Quick Evaluation

## 2020-05-29 LAB — SURGICAL PATHOLOGY

## 2020-05-30 ENCOUNTER — Encounter: Payer: Self-pay | Admitting: Gastroenterology

## 2020-11-20 ENCOUNTER — Other Ambulatory Visit (HOSPITAL_COMMUNITY)
Admission: RE | Admit: 2020-11-20 | Discharge: 2020-11-20 | Disposition: A | Discharge status: Home / Self Care | Payer: Commercial Managed Care - PPO | Source: Ambulatory Visit | Attending: Obstetrics and Gynecology | Admitting: Obstetrics and Gynecology

## 2020-11-20 ENCOUNTER — Other Ambulatory Visit: Payer: Self-pay

## 2020-11-20 ENCOUNTER — Ambulatory Visit (INDEPENDENT_AMBULATORY_CARE_PROVIDER_SITE_OTHER): Payer: Commercial Managed Care - PPO | Admitting: Obstetrics and Gynecology

## 2020-11-20 ENCOUNTER — Encounter: Payer: Self-pay | Admitting: Obstetrics and Gynecology

## 2020-11-20 VITALS — BP 108/62 | HR 84 | Ht 62.0 in | Wt 126.0 lb

## 2020-11-20 DIAGNOSIS — Z124 Encounter for screening for malignant neoplasm of cervix: Secondary | ICD-10-CM

## 2020-11-20 DIAGNOSIS — Z1239 Encounter for other screening for malignant neoplasm of breast: Secondary | ICD-10-CM

## 2020-11-20 DIAGNOSIS — Z01419 Encounter for gynecological examination (general) (routine) without abnormal findings: Secondary | ICD-10-CM

## 2020-11-20 NOTE — Patient Instructions (Signed)
[facility] [address] [place] [address]  [facility] [place]  [address] [place] [address]  Phone: [phone]  

## 2020-11-20 NOTE — Progress Notes (Signed)
Gynecology Annual Exam  PCP: Dorcas Carrow, DO  Chief Complaint:  Chief Complaint  Patient presents with   Gynecologic Exam    Annual - constipation and bloating. RM 5    History of Present Illness: Patient is a 50 y.o. G1P1 presents for annual exam. The patient has no complaints today.   LMP: No LMP recorded (lmp unknown). Patient has had a hysterectomy.  The patient is sexually active. She currently uses status post hysterectomy for contraception. She denies dyspareunia.  The patient does perform self breast exams.  There is no notable family history of breast or ovarian cancer in her family.  The patient wears seatbelts: yes.   The patient has regular exercise: not asked.    The patient denies current symptoms of depression.    Review of Systems: Review of Systems  Constitutional:  Negative for chills and fever.  HENT:  Negative for congestion.   Respiratory:  Negative for cough and shortness of breath.   Cardiovascular:  Negative for chest pain and palpitations.  Gastrointestinal:  Negative for abdominal pain, constipation, diarrhea, heartburn, nausea and vomiting.  Genitourinary:  Negative for dysuria, frequency and urgency.  Skin:  Negative for itching and rash.  Neurological:  Negative for dizziness and headaches.  Endo/Heme/Allergies:  Negative for polydipsia.  Psychiatric/Behavioral:  Negative for depression.    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   Abnormal Pap smear of cervix 10/20/2017    10/26/03 ASCUS 05/20/05 NIL 06/11/07 NIL 07/11/08 NIL  07/13/09 NIL HPV negative 08/17/09 Supracervical hysterectomy and BSO 07/16/10 NIL 07/20/12 NIL HPV positive 08/03/13 NIL HPV negative 08/08/14 LSIL HPV negative 08/23/14 Colposcopy VAIN I 02/16/15 LSIL HPV negative 08/15/15 LSIL HPV negative 08/24/15 Colposcopy VAIN I 09/22/15 OR excision  VAIN I left sidewall, ECC CIN I, 12 O'Clock ectocervix CIN I 09/11/16 ASCUS 10/17/16 Colposcopy negative 09/16/17 NIL HPV negative       Past Surgical History:  Past Surgical History:  Procedure Laterality Date   ABDOMINAL HYSTERECTOMY     COLONOSCOPY WITH PROPOFOL N/A 05/25/2020   Procedure: COLONOSCOPY WITH PROPOFOL;  Surgeon: Pasty Spillers, MD;  Location: [facility];  Service: Endoscopy;  Laterality: N/A;   COLPOSCOPY N/A 09/22/2015   Procedure: COLPOSCOPY;  Surgeon: Vena Austria, MD;  Location: [facility];  Service: Gynecology;  Laterality: N/A;   EYE SURGERY     LESION REMOVAL N/A 09/22/2015   Procedure: EXCISION VAGINAL LESION;  Surgeon: Vena Austria, MD;  Location: [facility];  Service: Gynecology;  Laterality: N/A;    Gynecologic History:  No LMP recorded (lmp unknown). Patient has had a hysterectomy. Contraception: status post hysterectomy Last Pap: Results were: 11/15/2019 LGSIL HPV negative Last mammogram: 11/23/2019 Results were: BI-RAD I  Obstetric History: G1P1  Family History:  Family History  Problem Relation Age of Onset   Skin cancer Mother 70   Glaucoma Mother    Diabetes Maternal Uncle    Skin cancer Maternal Grandmother    Leukemia Maternal Grandmother    CAD Maternal Grandmother    Glaucoma Maternal Grandmother    CAD Father    Hyperlipidemia Father    Heart disease Father    Atrial fibrillation Father    Obesity Sister    Osteoarthritis Sister    Asthma Sister    Breast cancer Paternal Aunt 60   Cancer Paternal Grandmother  Social History:  Social History   Socioeconomic History   Marital status: Married    Spouse name: Not on file   Number of children: Not on file   Years of education: Not on file   Highest education level: Not on file  Occupational History   Not on file  Tobacco Use   Smoking status: Never   Smokeless tobacco: Never  Vaping Use   Vaping Use: Never used  Substance and Sexual Activity   Alcohol use:  No   Drug use: No   Sexual activity: Yes    Birth control/protection: Surgical    Comment: Hysterectomy  Other Topics Concern   Not on file  Social History Narrative   Not on file   Social Determinants of Health   Financial Resource Strain: Not on file  Food Insecurity: Not on file  Transportation Needs: Not on file  Physical Activity: Not on file  Stress: Not on file  Social Connections: Not on file  Intimate Partner Violence: Not on file    Allergies:  Allergies  Allergen Reactions   Erythromycin Hives   Keflex [Cephalexin] Hives   Penicillin G     Other reaction(s): Unknown    Medications: Prior to Admission medications   Medication Sig Start Date End Date Taking? Authorizing Provider  azelastine (ASTELIN) 0.1 % nasal spray Place 2 sprays into both nostrils 2 (two) times daily. Use in each nostril as directed 03/31/20  Yes Johnson, Megan P, DO  Biotin 10 MG CAPS Take by mouth.   Yes [provider]  calcium carbonate 1250 MG capsule Take 1,250 mg by mouth daily.   Yes [provider]  Cholecalciferol (VITAMIN D3) 2000 units capsule Take by mouth.   Yes [provider]  estradiol (ESTRACE VAGINAL) 0.1 MG/GM vaginal cream 1 gram vaginally twice weekly 03/31/20  Yes Johnson, Megan P, DO  Lactobacillus Acid-Pectin (ACIDOPHILUS/PECTIN) CAPS Take by mouth.   Yes [provider]  Multiple Vitamin (MULTIVITAMIN) capsule Take 1 capsule by mouth daily.   Yes [provider]  Multiple Vitamins-Minerals (ZINC PO) Take by mouth daily.   Yes [provider]  Na Sulfate-K Sulfate-Mg Sulf 17.5-3.13-1.6 GM/177ML SOLN At 5 PM the day before procedure take 1 bottle and 5 hours before procedure take 1 bottle. 05/15/20  Yes Tahiliani, Michel Bickers B, MD  NAFTIN 2 % GEL APPLY DAILY TO AFFECTED AREA(S) PLUS A 0.5 INCH MARGIN TO HEALTHY, SURROUNDING SKIN FOR 2 WEEKS 12/12/16  Yes Copland, Alicia B, PA-C  spironolactone (ALDACTONE) 50 MG tablet  2(TWO) TABLET(S) ORAL EVERY DAY 03/31/20  Yes Johnson, Megan P, DO  TURMERIC PO Take by mouth daily.   Yes [provider]  valACYclovir (VALTREX) 500 MG tablet Take 1 tablet (500 mg total) by mouth daily. 03/31/20  Yes Johnson, Megan P, DO  vitamin C (ASCORBIC ACID) 500 MG tablet Take by mouth.   Yes [provider]  OPZELURA 1.5 % CREA Apply 1 application topically 2 (two) times daily. 08/07/20   [provider]  ranitidine (ZANTAC) 75 MG tablet Take 75 mg by mouth daily as needed for heartburn.    [provider]    Physical Exam Vitals: Blood pressure 108/62, pulse 84, height 5\' 2"  (1.575 m), weight 126 lb (57.2 kg).  General: NAD HEENT: normocephalic, anicteric Thyroid: no enlargement, no palpable nodules Pulmonary: No increased work of breathing, CTAB Cardiovascular: RRR, distal pulses 2+ Breast: Breast symmetrical, no tenderness, no palpable nodules or masses, no skin or  nipple retraction present, no nipple discharge.  No axillary or supraclavicular lymphadenopathy. Abdomen: NABS, soft, non-tender, non-distended.  Umbilicus without lesions.  No hepatomegaly, splenomegaly or masses palpable. No evidence of hernia  Genitourinary:  External: Normal external female genitalia.  Normal urethral meatus, normal Bartholin's and Skene's glands.    Vagina: Normal vaginal mucosa, no evidence of prolapse.    Cervix: surgically absent  Uterus: surgically absent  Adnexa: surgically absent  Rectal: deferred  Lymphatic: no evidence of inguinal lymphadenopathy Extremities: no edema, erythema, or tenderness Neurologic: Grossly intact Psychiatric: mood appropriate, affect full  Female chaperone present for pelvic and breast  portions of the physical exam    Assessment: 50 y.o. G1P1 routine annual exam  Plan: Problem List Items Addressed This Visit   None Visit Diagnoses     Encounter for gynecological examination without abnormal finding    -  Primary    Screening for malignant neoplasm of cervix       Breast screening           1) Mammogram - recommend yearly screening mammogram.  Mammogram Was ordered today   2) STI screening  was notoffered and therefore not obtained  3) ASCCP guidelines and rational discussed.  Patient opts for yearly screening interval  4) Contraception - status post hysterectomy  5) Colonoscopy -- up to date  6) Routine healthcare maintenance including cholesterol, diabetes screening discussed managed by PCP  7) Return in about 1 year (around 11/20/2021) for annual.   Vena Austria, MD, [facility], [facility] 11/20/2020, 8:50 AM

## 2020-11-20 NOTE — Addendum Note (Signed)
Addended by: Lorrene Reid on: 11/20/2020 01:42 PM   Modules accepted: Orders

## 2020-11-23 LAB — CYTOLOGY - PAP
Comment: NEGATIVE
Diagnosis: NEGATIVE
High risk HPV: NEGATIVE

## 2020-12-13 ENCOUNTER — Ambulatory Visit
Admission: RE | Admit: 2020-12-13 | Discharge: 2020-12-13 | Disposition: A | Discharge status: Home / Self Care | Payer: Commercial Managed Care - PPO | Source: Ambulatory Visit | Attending: Obstetrics and Gynecology | Admitting: Obstetrics and Gynecology

## 2020-12-13 ENCOUNTER — Other Ambulatory Visit: Payer: Self-pay

## 2020-12-13 DIAGNOSIS — Z1239 Encounter for other screening for malignant neoplasm of breast: Secondary | ICD-10-CM

## 2020-12-13 DIAGNOSIS — Z1231 Encounter for screening mammogram for malignant neoplasm of breast: Secondary | ICD-10-CM | POA: Diagnosis not present

## 2021-03-08 ENCOUNTER — Other Ambulatory Visit: Payer: Self-pay | Admitting: Family Medicine

## 2021-03-31 NOTE — Progress Notes (Signed)
BP 100/69   Pulse 71   Ht 5' 1.5" (1.562 m)   Wt 129 lb 3.2 oz (58.6 kg)   LMP  (LMP Unknown)   SpO2 96%   BMI 24.02 kg/m    Subjective:    Patient ID: Heidi Rodriguez, female    DOB: Nov 14, 1970, 50 y.o.   MRN: [id number]  HPI: Heidi Rodriguez is a 50 y.o. female presenting on 04/02/2021 for comprehensive medical examination. Current medical complaints include:  Has been having a lot of stress recently. Has been sleeping well. She is not happy with her weight.  Hit her pinky toe on the L foot on a metal bed last week. Getting better.   She currently lives with: husband  Menopausal Symptoms: no  Depression Screen done today and results listed below:  Depression screen [facility] 2/9 04/02/2021 03/31/2020 03/26/2019 03/23/2018 10/20/2017  Decreased Interest 0 0 0 0 0  Down, Depressed, Hopeless 0 0 0 0 0  PHQ - 2 Score 0 0 0 0 0  Altered sleeping 0 - 0 0 -  Tired, decreased energy 0 - 0 0 -  Change in appetite 0 - 0 0 -  Feeling bad or failure about yourself  0 - 0 0 -  Trouble concentrating 0 - 0 0 -  Moving slowly or fidgety/restless 0 - 0 0 -  Suicidal thoughts 0 - 0 0 -  PHQ-9 Score 0 - 0 0 -  Difficult doing work/chores - - Not difficult at all - -    Past Medical History:  Past Medical History:  Diagnosis Date   Abnormal cytology    CIN I (cervical intraepithelial neoplasia I)    GERD (gastroesophageal reflux disease)    Herpes genitalis    PONV (postoperative nausea and vomiting)    Vulvar intraepithelial neoplasia (VIN) grade 1     Surgical History:  Past Surgical History:  Procedure Laterality Date   ABDOMINAL HYSTERECTOMY     COLONOSCOPY WITH PROPOFOL N/A 05/25/2020   Procedure: COLONOSCOPY WITH PROPOFOL;  Surgeon: Pasty Spillers, MD;  Location: [facility];  Service: Endoscopy;  Laterality: N/A;   COLPOSCOPY N/A 09/22/2015   Procedure: COLPOSCOPY;  Surgeon: Vena Austria, MD;  Location: [facility];  Service: Gynecology;  Laterality: N/A;   EYE SURGERY      LESION REMOVAL N/A 09/22/2015   Procedure: EXCISION VAGINAL LESION;  Surgeon: Vena Austria, MD;  Location: [facility];  Service: Gynecology;  Laterality: N/A;    Medications:  Current Outpatient Medications on File Prior to Visit  Medication Sig   Biotin 10 MG CAPS Take by mouth.   calcium carbonate 1250 MG capsule Take 1,250 mg by mouth daily.   Cholecalciferol (VITAMIN D3) 2000 units capsule Take by mouth.   estradiol (ESTRACE VAGINAL) 0.1 MG/GM vaginal cream 1 gram vaginally twice weekly   Lactobacillus Acid-Pectin (ACIDOPHILUS/PECTIN) CAPS Take by mouth.   Multiple Vitamin (MULTIVITAMIN) capsule Take 1 capsule by mouth daily.   Multiple Vitamins-Minerals (ZINC PO) Take by mouth daily.   Na Sulfate-K Sulfate-Mg Sulf 17.5-3.13-1.6 GM/177ML SOLN At 5 PM the day before procedure take 1 bottle and 5 hours before procedure take 1 bottle.   NAFTIN 2 % GEL APPLY DAILY TO AFFECTED AREA(S) PLUS A 0.5 INCH MARGIN TO HEALTHY, SURROUNDING SKIN FOR 2 WEEKS   OPZELURA 1.5 % CREA Apply 1 application topically 2 (two) times daily.   ranitidine (ZANTAC) 75 MG tablet Take 75 mg by mouth daily as needed for heartburn.   TURMERIC  PO Take by mouth daily.   vitamin C (ASCORBIC ACID) 500 MG tablet Take by mouth.   No current facility-administered medications on file prior to visit.    Allergies:  Allergies  Allergen Reactions   Erythromycin Hives   Keflex [Cephalexin] Hives   Penicillin G     Other reaction(s): Unknown    Social History:  Social History   Socioeconomic History   Marital status: Married    Spouse name: Not on file   Number of children: Not on file   Years of education: Not on file   Highest education level: Not on file  Occupational History   Not on file  Tobacco Use   Smoking status: Never   Smokeless tobacco: Never  Vaping Use   Vaping Use: Never used  Substance and Sexual Activity   Alcohol use: No   Drug use: No   Sexual activity: Yes    Birth  control/protection: Surgical    Comment: Hysterectomy  Other Topics Concern   Not on file  Social History Narrative   Not on file   Social Determinants of Health   Financial Resource Strain: Not on file  Food Insecurity: Not on file  Transportation Needs: Not on file  Physical Activity: Not on file  Stress: Not on file  Social Connections: Not on file  Intimate Partner Violence: Not on file   Social History   Tobacco Use  Smoking Status Never  Smokeless Tobacco Never   Social History   Substance and Sexual Activity  Alcohol Use No    Family History:  Family History  Problem Relation Age of Onset   Skin cancer Mother 52   Glaucoma Mother    Diabetes Maternal Uncle    Skin cancer Maternal Grandmother    Leukemia Maternal Grandmother    CAD Maternal Grandmother    Glaucoma Maternal Grandmother    CAD Father    Hyperlipidemia Father    Heart disease Father    Atrial fibrillation Father    Obesity Sister    Osteoarthritis Sister    Asthma Sister    Breast cancer Paternal Aunt 55   Cancer Paternal Grandmother     Past medical history, surgical history, medications, allergies, family history and social history reviewed with patient today and changes made to appropriate areas of the chart.   Review of Systems  Constitutional: Negative.   HENT: Negative.    Eyes: Negative.   Respiratory: Negative.    Cardiovascular: Negative.   Gastrointestinal:  Positive for constipation. Negative for abdominal pain, blood in stool, diarrhea, heartburn, melena, nausea and vomiting.       +bloating  Genitourinary: Negative.   Musculoskeletal: Negative.   Skin: Negative.   Neurological: Negative.   Endo/Heme/Allergies:  Positive for environmental allergies. Negative for polydipsia. Does not bruise/bleed easily.  Psychiatric/Behavioral: Negative.    All other ROS negative except what is listed above and in the HPI.      Objective:    BP 100/69   Pulse 71   Ht 5' 1.5"  (1.562 m)   Wt 129 lb 3.2 oz (58.6 kg)   LMP  (LMP Unknown)   SpO2 96%   BMI 24.02 kg/m   Wt Readings from Last 3 Encounters:  04/02/21 129 lb 3.2 oz (58.6 kg)  11/20/20 126 lb (57.2 kg)  05/25/20 121 lb 6.4 oz (55.1 kg)    Physical Exam Vitals and nursing note reviewed.  Constitutional:      General: She is not  in acute distress.    Appearance: Normal appearance. She is not ill-appearing, toxic-appearing or diaphoretic.  HENT:     Head: Normocephalic and atraumatic.     Right Ear: Tympanic membrane, ear canal and external ear normal. There is no impacted cerumen.     Left Ear: Tympanic membrane, ear canal and external ear normal. There is no impacted cerumen.     Nose: Nose normal. No congestion or rhinorrhea.     Mouth/Throat:     Mouth: Mucous membranes are moist.     Pharynx: Oropharynx is clear. No oropharyngeal exudate or posterior oropharyngeal erythema.  Eyes:     General: No scleral icterus.       Right eye: No discharge.        Left eye: No discharge.     Extraocular Movements: Extraocular movements intact.     Conjunctiva/sclera: Conjunctivae normal.     Pupils: Pupils are equal, round, and reactive to light.  Neck:     Vascular: No carotid bruit.  Cardiovascular:     Rate and Rhythm: Normal rate and regular rhythm.     Pulses: Normal pulses.     Heart sounds: No murmur heard.   No friction rub. No gallop.  Pulmonary:     Effort: Pulmonary effort is normal. No respiratory distress.     Breath sounds: Normal breath sounds. No stridor. No wheezing, rhonchi or rales.  Chest:     Chest wall: No tenderness.  Abdominal:     General: Abdomen is flat. Bowel sounds are normal. There is no distension.     Palpations: Abdomen is soft. There is no mass.     Tenderness: There is no abdominal tenderness. There is no right CVA tenderness, left CVA tenderness, guarding or rebound.     Hernia: No hernia is present.  Genitourinary:    Comments: Breast and pelvic exams  deferred with shared decision making Musculoskeletal:        General: No swelling, tenderness, deformity or signs of injury. Normal range of motion.     Cervical back: Normal range of motion and neck supple. No rigidity. No muscular tenderness.     Right lower leg: No edema.     Left lower leg: No edema.  Lymphadenopathy:     Cervical: No cervical adenopathy.  Skin:    General: Skin is warm and dry.     Capillary Refill: Capillary refill takes less than 2 seconds.     Coloration: Skin is not jaundiced or pale.     Findings: No bruising, erythema, lesion or rash.  Neurological:     General: No focal deficit present.     Mental Status: She is alert and oriented to person, place, and time. Mental status is at baseline.     Cranial Nerves: No cranial nerve deficit.     Sensory: No sensory deficit.     Motor: No weakness.     Coordination: Coordination normal.     Gait: Gait normal.     Deep Tendon Reflexes: Reflexes normal.  Psychiatric:        Mood and Affect: Mood normal.        Behavior: Behavior normal.        Thought Content: Thought content normal.        Judgment: Judgment normal.    Results for orders placed or performed in visit on 11/20/20  Cytology - PAP  Result Value Ref Range   High risk HPV Negative    Adequacy  Satisfactory for evaluation. The presence or absence of an   Adequacy      endocervical/transformation zone component cannot be determined because   Adequacy of atrophy.    Diagnosis      - Negative for intraepithelial lesion or malignancy (NILM)   Comment Inflammation and atrophic changes are present.    Comment Normal Reference Range HPV - Negative       Assessment & Plan:   Problem List Items Addressed This Visit   None Visit Diagnoses     Routine general medical examination at a health care facility    -  Primary   Vaccines up to date. Screening labs checked today including hormones. Pap, mammo and colonoscopy up to date. Continue diet and  exercise. Call with any concerns.   Relevant Orders   CBC with Differential/Platelet   Comprehensive metabolic panel   Lipid Panel w/o Chol/HDL Ratio   Urinalysis, Routine w reflex microscopic   TSH   Hepatitis C Antibody   HIV Antibody (routine testing w rflx)   LH   FSH   Estradiol   Testosterone, free, total([facility])        Follow up plan: Return in about 1 year (around 04/02/2022), or physical.   LABORATORY TESTING:  - Pap smear: up to date  IMMUNIZATIONS:   - Tdap: Tetanus vaccination status reviewed: last tetanus booster within 10 years. - Influenza: Up to date - Pneumovax: Not applicable - Prevnar: Not applicable - COVID: Up to date  SCREENING: -Mammogram: Up to date  - Colonoscopy: Up to date   PATIENT COUNSELING:   Advised to take 1 mg of folate supplement per day if capable of pregnancy.   Sexuality: Discussed sexually transmitted diseases, partner selection, use of condoms, avoidance of unintended pregnancy  and contraceptive alternatives.   Advised to avoid cigarette smoking.  I discussed with the patient that most people either abstain from alcohol or drink within safe limits (<=14/week and <=4 drinks/occasion for males, <=7/weeks and <= 3 drinks/occasion for females) and that the risk for alcohol disorders and other health effects rises proportionally with the number of drinks per week and how often a drinker exceeds daily limits.  Discussed cessation/primary prevention of drug use and availability of treatment for abuse.   Diet: Encouraged to adjust caloric intake to maintain  or achieve ideal body weight, to reduce intake of dietary saturated fat and total fat, to limit sodium intake by avoiding high sodium foods and not adding table salt, and to maintain adequate dietary potassium and calcium preferably from fresh fruits, vegetables, and low-fat dairy products.    stressed the importance of regular exercise  Injury prevention: Discussed  safety belts, safety helmets, smoke detector, smoking near bedding or upholstery.   Dental health: Discussed importance of regular tooth brushing, flossing, and dental visits.    NEXT PREVENTATIVE PHYSICAL DUE IN 1 YEAR. Return in about 1 year (around 04/02/2022), or physical.

## 2021-04-02 ENCOUNTER — Other Ambulatory Visit: Payer: Self-pay

## 2021-04-02 ENCOUNTER — Ambulatory Visit (INDEPENDENT_AMBULATORY_CARE_PROVIDER_SITE_OTHER): Payer: Commercial Managed Care - PPO | Admitting: Family Medicine

## 2021-04-02 ENCOUNTER — Encounter: Payer: Self-pay | Admitting: Family Medicine

## 2021-04-02 VITALS — BP 100/69 | HR 71 | Ht 61.5 in | Wt 129.2 lb

## 2021-04-02 DIAGNOSIS — Z23 Encounter for immunization: Secondary | ICD-10-CM | POA: Diagnosis not present

## 2021-04-02 DIAGNOSIS — Z0000 Encounter for general adult medical examination without abnormal findings: Secondary | ICD-10-CM | POA: Diagnosis not present

## 2021-04-02 LAB — URINALYSIS, ROUTINE W REFLEX MICROSCOPIC
Bilirubin, UA: NEGATIVE
Glucose, UA: NEGATIVE
Ketones, UA: NEGATIVE
Leukocytes,UA: NEGATIVE
Nitrite, UA: NEGATIVE
Protein,UA: NEGATIVE
RBC, UA: NEGATIVE
Specific Gravity, UA: 1.01 (ref 1.005–1.030)
Urobilinogen, Ur: 0.2 mg/dL (ref 0.2–1.0)
pH, UA: 7.5 (ref 5.0–7.5)

## 2021-04-02 MED ORDER — VALACYCLOVIR HCL 500 MG PO TABS: 500.0000 mg | ORAL_TABLET | Freq: Every day | ORAL | 3 refills | Status: DC

## 2021-04-02 MED ORDER — AZELASTINE HCL 0.1 % NA SOLN: 2.0000 | Freq: Two times a day (BID) | NASAL | 12 refills | Status: DC

## 2021-04-02 MED ORDER — SPIRONOLACTONE 50 MG PO TABS: ORAL_TABLET | ORAL | 3 refills | Status: DC

## 2021-04-03 LAB — CBC WITH DIFFERENTIAL/PLATELET
Basophils Absolute: 0 10*3/uL (ref 0.0–0.2)
Basos: 1 %
EOS (ABSOLUTE): 0.3 10*3/uL (ref 0.0–0.4)
Eos: 4 %
Hematocrit: 43.6 % (ref 34.0–46.6)
Hemoglobin: 14.7 g/dL (ref 11.1–15.9)
Immature Grans (Abs): 0 10*3/uL (ref 0.0–0.1)
Immature Granulocytes: 0 %
Lymphocytes Absolute: 1.4 10*3/uL (ref 0.7–3.1)
Lymphs: 22 %
MCH: 33.4 pg — ABNORMAL HIGH (ref 26.6–33.0)
MCHC: 33.7 g/dL (ref 31.5–35.7)
MCV: 99 fL — ABNORMAL HIGH (ref 79–97)
Monocytes Absolute: 0.4 10*3/uL (ref 0.1–0.9)
Monocytes: 6 %
Neutrophils Absolute: 4.5 10*3/uL (ref 1.4–7.0)
Neutrophils: 67 %
Platelets: 237 10*3/uL (ref 150–450)
RBC: 4.4 x10E6/uL (ref 3.77–5.28)
RDW: 12.3 % (ref 11.7–15.4)
WBC: 6.7 10*3/uL (ref 3.4–10.8)

## 2021-04-03 LAB — LIPID PANEL W/O CHOL/HDL RATIO
Cholesterol, Total: 212 mg/dL — ABNORMAL HIGH (ref 100–199)
HDL: 46 mg/dL (ref >39)
LDL Chol Calc (NIH): 112 mg/dL — ABNORMAL HIGH (ref 0–99)
Triglycerides: 315 mg/dL — ABNORMAL HIGH (ref 0–149)
VLDL Cholesterol Cal: 54 mg/dL — ABNORMAL HIGH (ref 5–40)

## 2021-04-03 LAB — COMPREHENSIVE METABOLIC PANEL
ALT: 29 IU/L (ref 0–32)
AST: 20 IU/L (ref 0–40)
Albumin/Globulin Ratio: 2.2 (ref 1.2–2.2)
Albumin: 4.7 g/dL (ref 3.8–4.8)
Alkaline Phosphatase: 97 IU/L (ref 44–121)
BUN/Creatinine Ratio: 17 (ref 9–23)
BUN: 15 mg/dL (ref 6–24)
Bilirubin Total: 1 mg/dL (ref 0.0–1.2)
CO2: 26 mmol/L (ref 20–29)
Calcium: 9.7 mg/dL (ref 8.7–10.2)
Chloride: 101 mmol/L (ref 96–106)
Creatinine, Ser: 0.86 mg/dL (ref 0.57–1.00)
Globulin, Total: 2.1 g/dL (ref 1.5–4.5)
Glucose: 85 mg/dL (ref 70–99)
Potassium: 3.9 mmol/L (ref 3.5–5.2)
Sodium: 140 mmol/L (ref 134–144)
Total Protein: 6.8 g/dL (ref 6.0–8.5)
eGFR: 84 mL/min/{1.73_m2} (ref >59)

## 2021-04-03 LAB — TSH: TSH: 1.76 u[IU]/mL (ref 0.450–4.500)

## 2021-04-03 LAB — HIV ANTIBODY (ROUTINE TESTING W REFLEX): HIV Screen 4th Generation wRfx: NONREACTIVE

## 2021-04-03 LAB — FOLLICLE STIMULATING HORMONE: FSH: 196 m[IU]/mL

## 2021-04-03 LAB — TESTOSTERONE, FREE, TOTAL, SHBG
Sex Hormone Binding: 45.8 nmol/L (ref 24.6–122.0)
Testosterone, Free: 0.3 pg/mL (ref 0.0–4.2)
Testosterone: <3 ng/dL — ABNORMAL LOW (ref 4–50)

## 2021-04-03 LAB — HEPATITIS C ANTIBODY: Hep C Virus Ab: <0.1 s/co ratio (ref 0.0–0.9)

## 2021-04-03 LAB — ESTRADIOL: Estradiol: <5 pg/mL

## 2021-04-03 LAB — LUTEINIZING HORMONE: LH: 56.4 m[IU]/mL

## 2021-06-01 ENCOUNTER — Other Ambulatory Visit: Payer: Self-pay

## 2021-06-01 ENCOUNTER — Other Ambulatory Visit: Payer: Self-pay | Admitting: Family Medicine

## 2021-06-01 ENCOUNTER — Encounter: Payer: Self-pay | Admitting: Family Medicine

## 2021-06-01 ENCOUNTER — Ambulatory Visit: Payer: Commercial Managed Care - PPO | Admitting: Family Medicine

## 2021-06-01 VITALS — BP 102/68 | HR 71

## 2021-06-01 DIAGNOSIS — N898 Other specified noninflammatory disorders of vagina: Secondary | ICD-10-CM

## 2021-06-01 DIAGNOSIS — N952 Postmenopausal atrophic vaginitis: Secondary | ICD-10-CM | POA: Insufficient documentation

## 2021-06-01 LAB — URINALYSIS, ROUTINE W REFLEX MICROSCOPIC
Bilirubin, UA: NEGATIVE
Glucose, UA: NEGATIVE
Ketones, UA: NEGATIVE
Leukocytes,UA: NEGATIVE
Nitrite, UA: NEGATIVE
Protein,UA: NEGATIVE
RBC, UA: NEGATIVE
Specific Gravity, UA: 1.015 (ref 1.005–1.030)
Urobilinogen, Ur: 0.2 mg/dL (ref 0.2–1.0)
pH, UA: 7 (ref 5.0–7.5)

## 2021-06-01 LAB — WET PREP FOR TRICH, YEAST, CLUE
Clue Cell Exam: NEGATIVE
Trichomonas Exam: NEGATIVE
Yeast Exam: NEGATIVE

## 2021-06-01 MED ORDER — PREMARIN 0.625 MG/GM VA CREA: TOPICAL_CREAM | VAGINAL | 12 refills | Status: DC

## 2021-06-01 NOTE — Assessment & Plan Note (Signed)
Will start premarin. Call with any concerns. Continue to monitor.

## 2021-06-01 NOTE — Progress Notes (Signed)
BP 102/68    Pulse 71    LMP  (LMP Unknown)    SpO2 97%    Subjective:    Patient ID: Heidi Rodriguez, female    DOB: 08/25/1970, 50 y.o.   MRN: [id number]  HPI: Heidi Rodriguez is a 50 y.o. female  Chief Complaint  Patient presents with   Vaginal Pain    Patient states she has pain in her vagina that feels like sandpaper. Does not have any discharge, foul odor, or bleeding but does feel a dull itch. Patient states she has symptoms since Sunday.     VAGINAL IRRITATION Duration: 5 days Discharge description: rough, dry  Pruritus: yes Dysuria: no Malodorous: no Urinary frequency: no Fevers: no Abdominal pain: no  Sexual activity: monogamous History of sexually transmitted diseases: no Recent antibiotic use: no Context: worse  Treatments attempted: antifungal  Relevant past medical, surgical, family and social history reviewed and updated as indicated. Interim medical history since our last visit reviewed. Allergies and medications reviewed and updated.  Review of Systems  Constitutional: Negative.   Respiratory: Negative.    Cardiovascular: Negative.   Gastrointestinal: Negative.   Genitourinary:  Positive for vaginal pain. Negative for decreased urine volume, difficulty urinating, dyspareunia, dysuria, enuresis, flank pain, frequency, genital sores, hematuria, menstrual problem, pelvic pain, urgency, vaginal bleeding and vaginal discharge.  Musculoskeletal: Negative.   Psychiatric/Behavioral: Negative.     Per HPI unless specifically indicated above     Objective:    BP 102/68    Pulse 71    LMP  (LMP Unknown)    SpO2 97%   Wt Readings from Last 3 Encounters:  04/02/21 129 lb 3.2 oz (58.6 kg)  11/20/20 126 lb (57.2 kg)  05/25/20 121 lb 6.4 oz (55.1 kg)    Physical Exam Vitals and nursing note reviewed.  Constitutional:      General: She is not in acute distress.    Appearance: Normal appearance. She is well-developed.  HENT:     Head: Normocephalic and  atraumatic.     Right Ear: Hearing and external ear normal.     Left Ear: Hearing and external ear normal.     Nose: Nose normal.     Mouth/Throat:     Mouth: Mucous membranes are moist.     Pharynx: Oropharynx is clear.  Eyes:     General: Lids are normal. No scleral icterus.       Right eye: No discharge.        Left eye: No discharge.     Conjunctiva/sclera: Conjunctivae normal.  Pulmonary:     Effort: Pulmonary effort is normal. No respiratory distress.  Genitourinary:    Vagina: Erythema present.     Comments: Shiny atrophic appearance Musculoskeletal:        General: Normal range of motion.  Skin:    Coloration: Skin is not jaundiced or pale.     Findings: No bruising, erythema, lesion or rash.  Neurological:     Mental Status: She is alert. Mental status is at baseline. She is disoriented.  Psychiatric:        Mood and Affect: Mood normal.        Speech: Speech normal.        Behavior: Behavior normal.        Thought Content: Thought content normal.        Judgment: Judgment normal.    Results for orders placed or performed in visit on 04/02/21  CBC with Differential/Platelet  Result  Value Ref Range   WBC 6.7 3.4 - 10.8 x10E3/uL   RBC 4.40 3.77 - 5.28 x10E6/uL   Hemoglobin 14.7 11.1 - 15.9 g/dL   Hematocrit 43.6 34.0 - 46.6 %   MCV 99 (H) 79 - 97 fL   MCH 33.4 (H) 26.6 - 33.0 pg   MCHC 33.7 31.5 - 35.7 g/dL   RDW 12.3 11.7 - 15.4 %   Platelets 237 150 - 450 x10E3/uL   Neutrophils 67 Not Estab. %   Lymphs 22 Not Estab. %   Monocytes 6 Not Estab. %   Eos 4 Not Estab. %   Basos 1 Not Estab. %   Neutrophils Absolute 4.5 1.4 - 7.0 x10E3/uL   Lymphocytes Absolute 1.4 0.7 - 3.1 x10E3/uL   Monocytes Absolute 0.4 0.1 - 0.9 x10E3/uL   EOS (ABSOLUTE) 0.3 0.0 - 0.4 x10E3/uL   Basophils Absolute 0.0 0.0 - 0.2 x10E3/uL   Immature Granulocytes 0 Not Estab. %   Immature Grans (Abs) 0.0 0.0 - 0.1 x10E3/uL  Comprehensive metabolic panel  Result Value Ref Range   Glucose  85 70 - 99 mg/dL   BUN 15 6 - 24 mg/dL   Creatinine, Ser 0.86 0.57 - 1.00 mg/dL   eGFR 84 >59 mL/min/1.73   BUN/Creatinine Ratio 17 9 - 23   Sodium 140 134 - 144 mmol/L   Potassium 3.9 3.5 - 5.2 mmol/L   Chloride 101 96 - 106 mmol/L   CO2 26 20 - 29 mmol/L   Calcium 9.7 8.7 - 10.2 mg/dL   Total Protein 6.8 6.0 - 8.5 g/dL   Albumin 4.7 3.8 - 4.8 g/dL   Globulin, Total 2.1 1.5 - 4.5 g/dL   Albumin/Globulin Ratio 2.2 1.2 - 2.2   Bilirubin Total 1.0 0.0 - 1.2 mg/dL   Alkaline Phosphatase 97 44 - 121 IU/L   AST 20 0 - 40 IU/L   ALT 29 0 - 32 IU/L  Lipid Panel w/o Chol/HDL Ratio  Result Value Ref Range   Cholesterol, Total 212 (H) 100 - 199 mg/dL   Triglycerides 315 (H) 0 - 149 mg/dL   HDL 46 >39 mg/dL   VLDL Cholesterol Cal 54 (H) 5 - 40 mg/dL   LDL Chol Calc (NIH) 112 (H) 0 - 99 mg/dL  Urinalysis, Routine w reflex microscopic  Result Value Ref Range   Specific Gravity, UA 1.010 1.005 - 1.030   pH, UA 7.5 5.0 - 7.5   Color, UA Yellow Yellow   Appearance Ur Clear Clear   Leukocytes,UA Negative Negative   Protein,UA Negative Negative/Trace   Glucose, UA Negative Negative   Ketones, UA Negative Negative   RBC, UA Negative Negative   Bilirubin, UA Negative Negative   Urobilinogen, Ur 0.2 0.2 - 1.0 mg/dL   Nitrite, UA Negative Negative  TSH  Result Value Ref Range   TSH 1.760 0.450 - 4.500 uIU/mL  Hepatitis C Antibody  Result Value Ref Range   Hep C Virus Ab <0.1 0.0 - 0.9 s/co ratio  HIV Antibody (routine testing w rflx)  Result Value Ref Range   HIV Screen 4th Generation wRfx Non Reactive Non Reactive  LH  Result Value Ref Range   LH 56.4 mIU/mL  FSH  Result Value Ref Range   FSH 196.0 mIU/mL  Estradiol  Result Value Ref Range   Estradiol <5.0 pg/mL  Testosterone, free, total([facility])  Result Value Ref Range   Testosterone <3 (L) 4 - 50 ng/dL   Testosterone, Free 0.3 0.0 -  4.2 pg/mL   Sex Hormone Binding 45.8 24.6 - 122.0 nmol/L      Assessment & Plan:    Problem List Items Addressed This Visit       Genitourinary   Atrophic vaginitis - Primary    Will start premarin. Call with any concerns. Continue to monitor.       Other Visit Diagnoses     Vaginal irritation       Relevant Orders   WET PREP FOR [place], YEAST, CLUE   Urinalysis, Routine w reflex microscopic        Follow up plan: Return if symptoms worsen or fail to improve.

## 2021-06-01 NOTE — Telephone Encounter (Signed)
Requested medication (s) are due for refill today: yes  Requested medication (s) are on the active medication list: yes  Last refill:   42.5 g 3 03/31/2020    Future visit scheduled: yes  Notes to clinic:  see pharmacy note. Requesting alternative.     Requested Prescriptions  Pending Prescriptions Disp Refills   estradiol (ESTRACE) 0.1 MG/GM vaginal cream [Pharmacy Med Name: ESTRADIOL 0.01% CREAM]  0     OB/GYN:  Estrogens Failed - 06/01/2021 11:21 AM      Failed - Mammogram is up-to-date per Health Maintenance      Passed - Last BP in normal range    BP Readings from Last 1 Encounters:  06/01/21 102/68          Passed - Valid encounter within last 12 months    Recent Outpatient Visits           Today Atrophic vaginitis   [facility] [place], Megan P, DO   2 months ago Routine general medical examination at a health care facility   [facility], [place] P, DO   1 year ago Routine general medical examination at a health care facility   [facility] [place], [place] P, DO   2 years ago Routine general medical examination at a health care facility   [facility], [place] P, DO   3 years ago Seasonal allergic rhinitis due to pollen   [facility], [place], [place]       Future Appointments             In 10 months Laural Benes, Oralia Rud, DO [employer] [facility]

## 2021-06-01 NOTE — Telephone Encounter (Signed)
°  Notes to clinic:  Pharm requesting clarification if this rx is for tablet or cream. Please assess. Requested Prescriptions  Pending Prescriptions Disp Refills   estradiol (ESTRACE) 0.1 MG/GM vaginal cream [Pharmacy Med Name: ESTRADIOL 0.01% CREAM] 42.5 g 12    Sig: APPLY 1 GRAM EVERY DAY FOR 2 WEEKS THEN DECREASE TO 1 TAB 2 TIMES A WEEK     OB/GYN:  Estrogens Failed - 06/01/2021  2:38 PM      Failed - Mammogram is up-to-date per Health Maintenance      Passed - Last BP in normal range    BP Readings from Last 1 Encounters:  06/01/21 102/68          Passed - Valid encounter within last 12 months    Recent Outpatient Visits           Today Atrophic vaginitis   [facility] [place], Megan P, DO   2 months ago Routine general medical examination at a health care facility   [facility], [place] P, DO   1 year ago Routine general medical examination at a health care facility   [facility] [place], [place] P, DO   2 years ago Routine general medical examination at a health care facility   [facility], [place] P, DO   3 years ago Seasonal allergic rhinitis due to pollen   [facility], [place], [place]       Future Appointments             In 10 months Laural Benes, Oralia Rud, DO [employer], [facility]

## 2021-11-24 IMAGING — MG MM DIGITAL SCREENING BILAT W/ TOMO AND CAD
8 series · 9 of 24 positions shown · non-contrast
Comparison: Previous exam(s).

CLINICAL DATA: Screening.

EXAM:
DIGITAL SCREENING BILATERAL MAMMOGRAM WITH TOMOSYNTHESIS AND CAD
TECHNIQUE: Bilateral screening digital craniocaudal and mediolateral oblique
mammograms were obtained. Bilateral screening digital breast
tomosynthesis was performed. The images were evaluated with
computer-aided detection.

[R CC synth-2D]
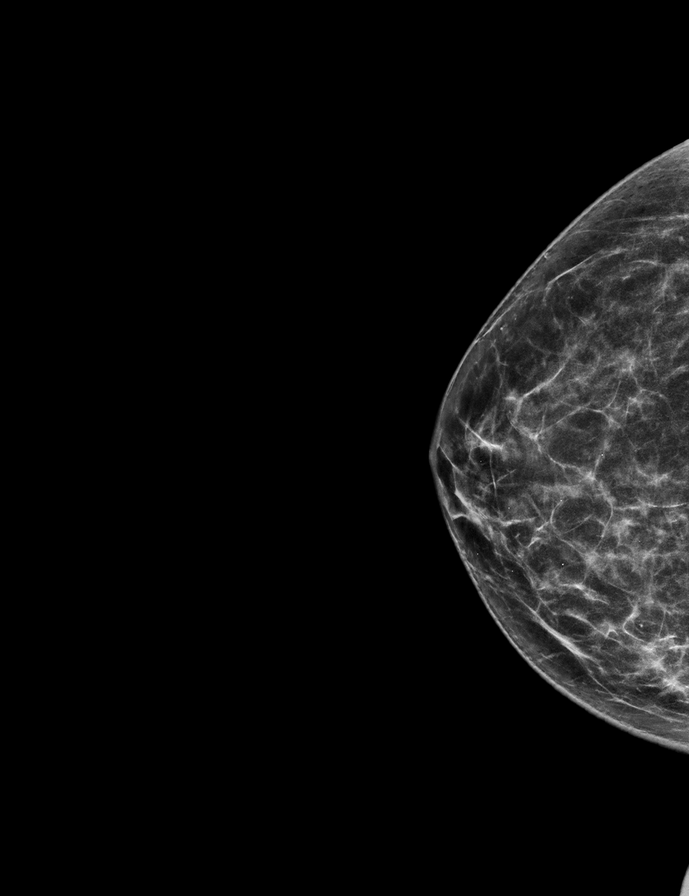

[L CC synth-2D]
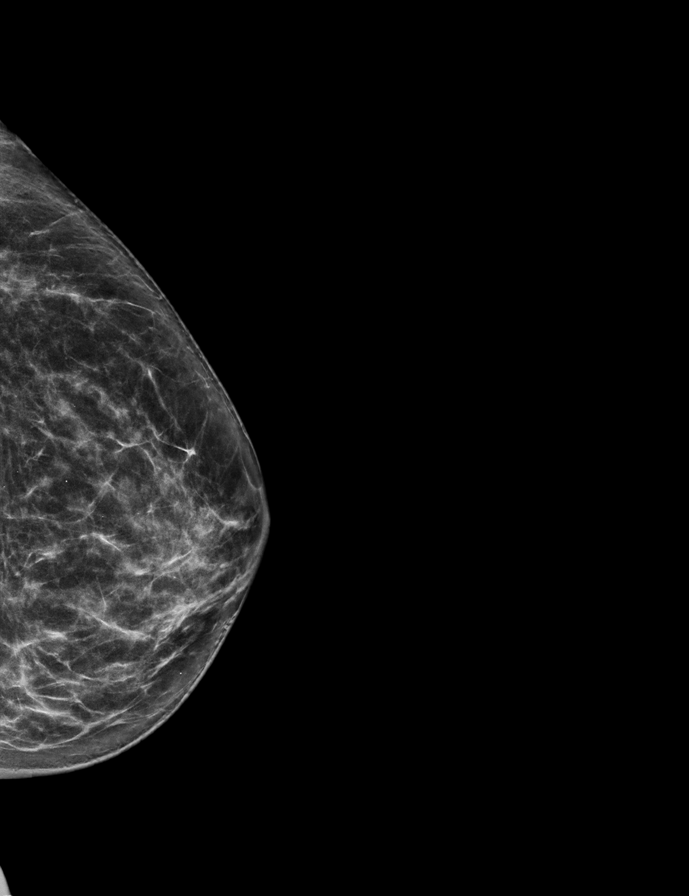

[L MLO synth-2D]
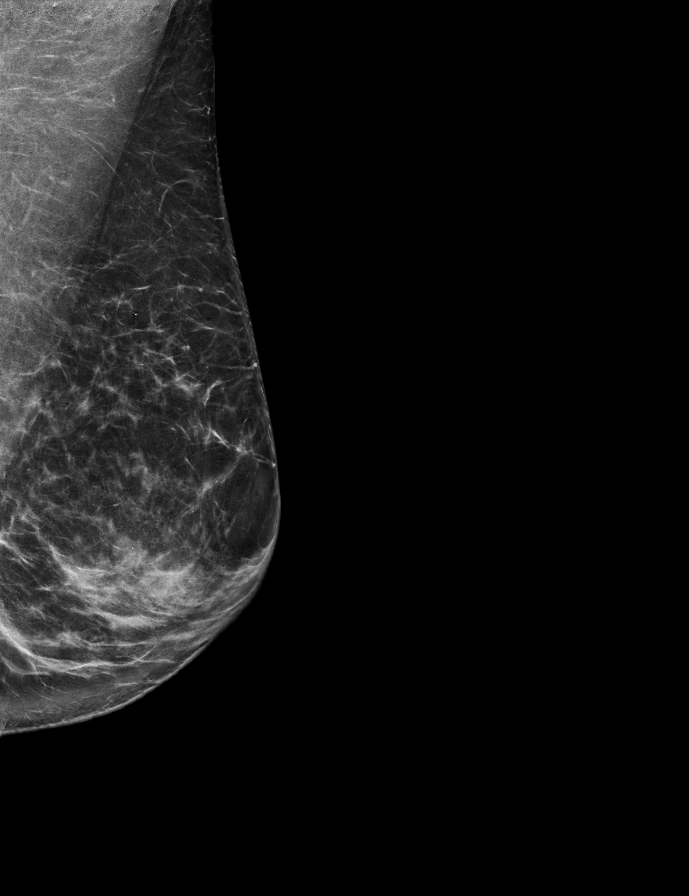

[R MLO synth-2D]
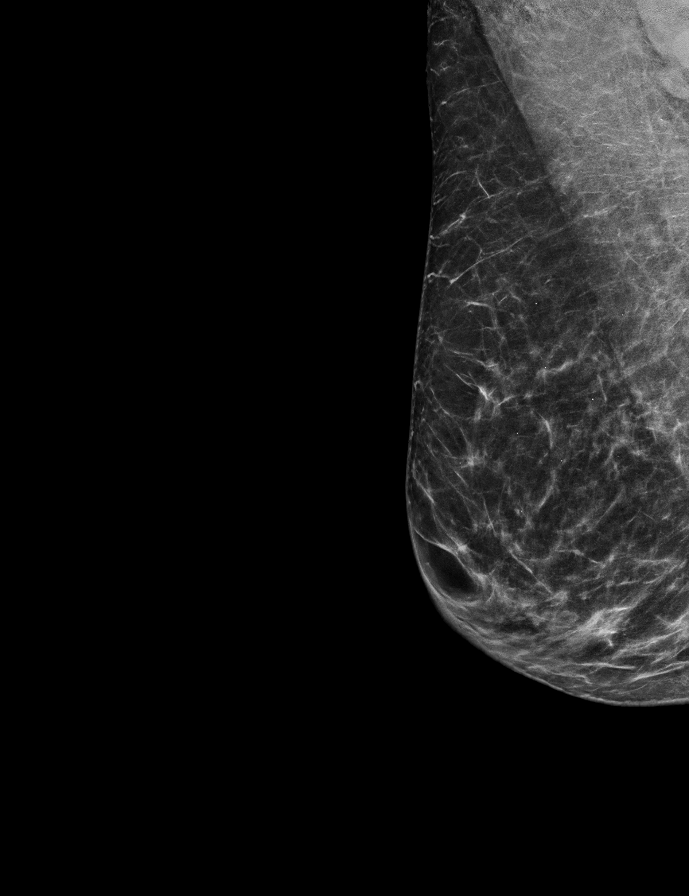

[L MLO tomo · 2 of 70 frames shown]
[frame 23/70]
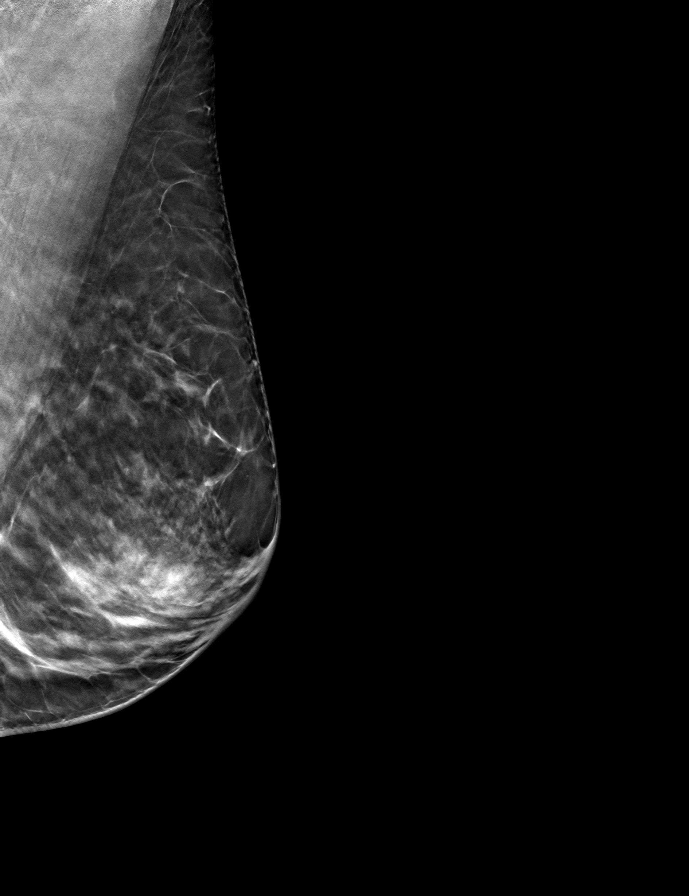
[frame 35/70]
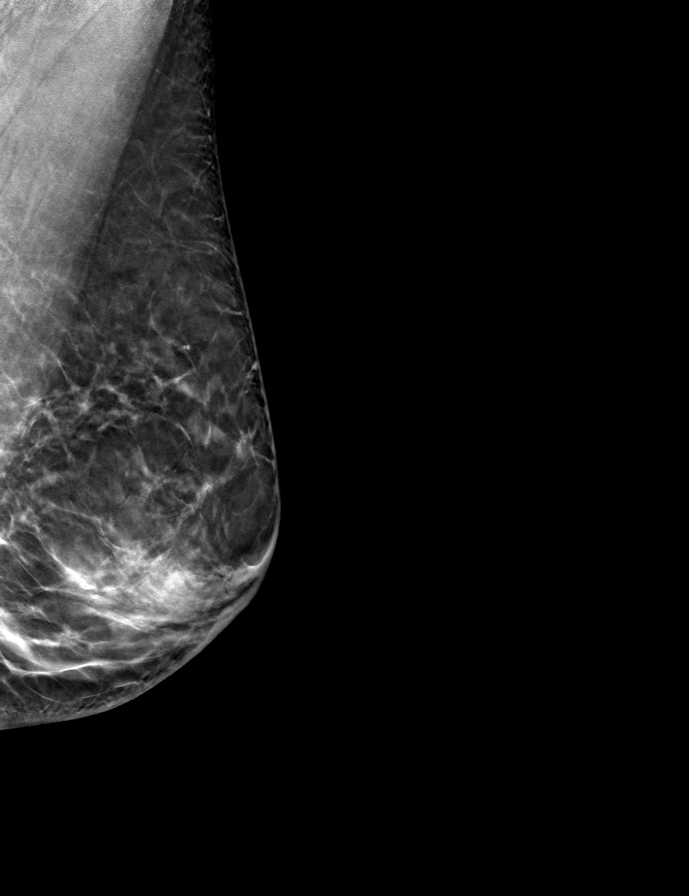

[R CC tomo · tomo slice 27/53.0]
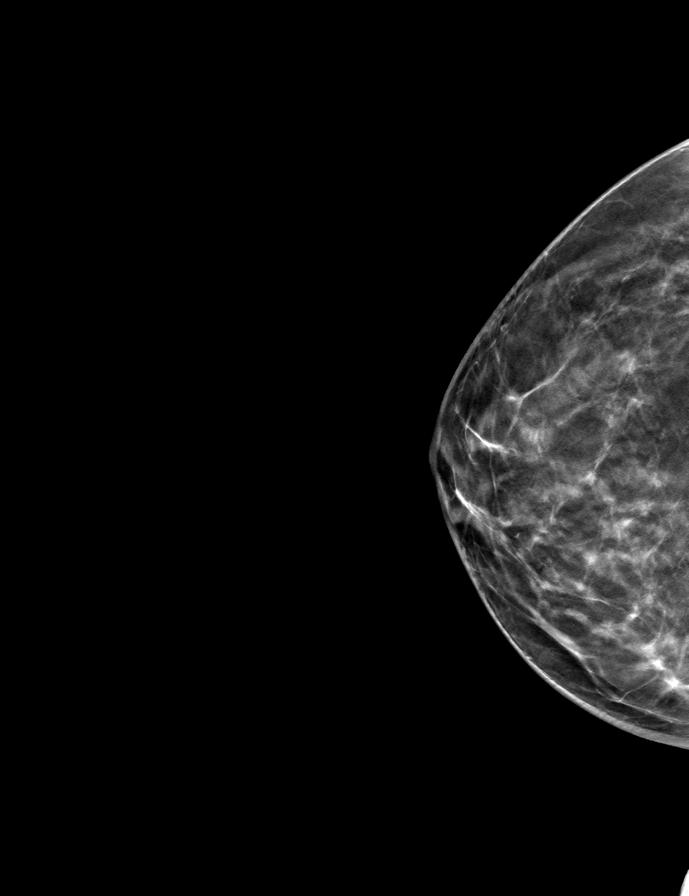

[R MLO tomo · tomo slice 33/64.0]
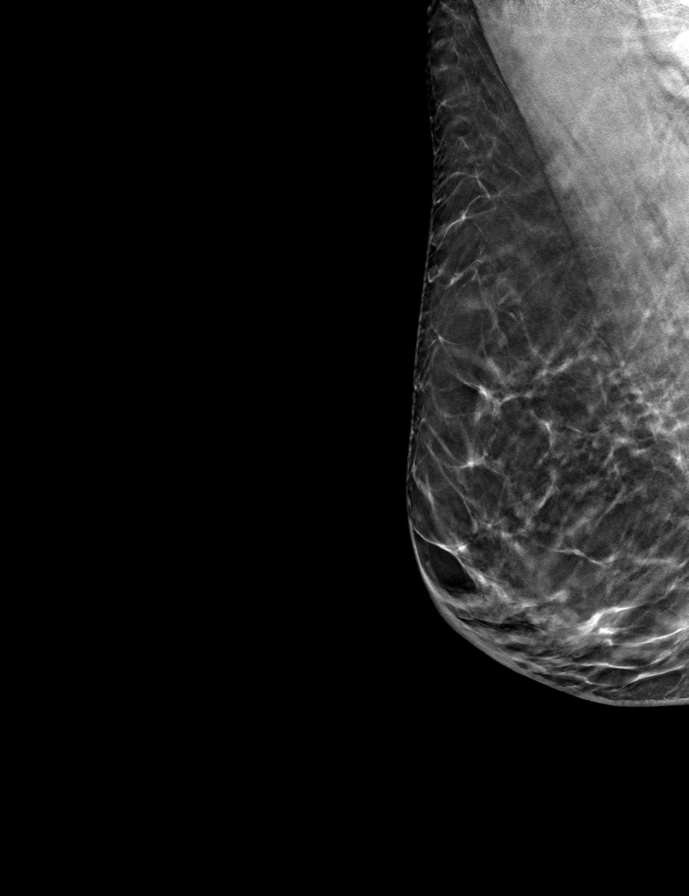

[L CC tomo · tomo slice 32/63.0]
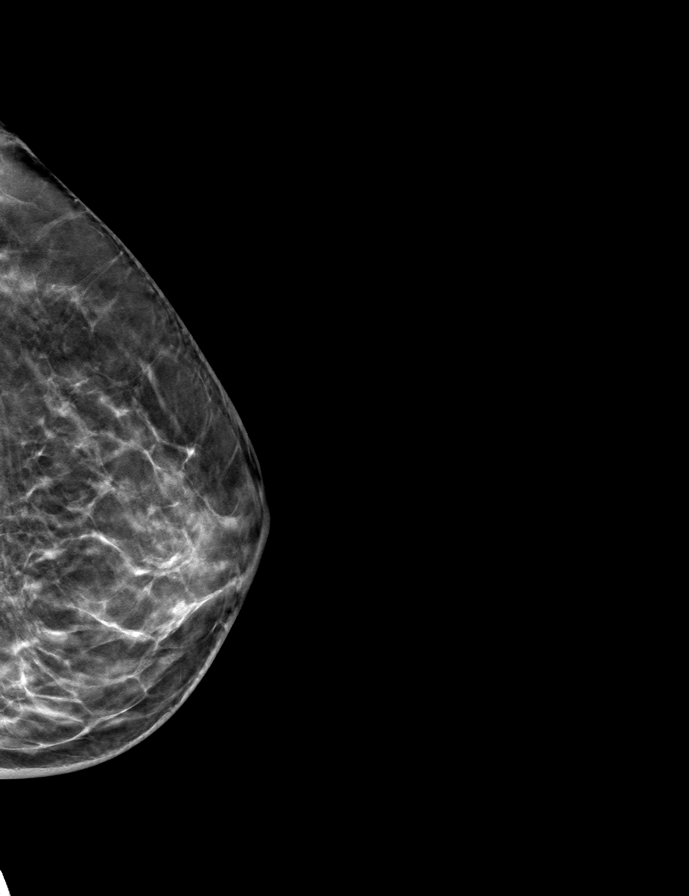

[9 of 24 positions shown; findings below may reference images not displayed]

ACR Breast Density Category c: The breast tissue is heterogeneously
dense, which may obscure small masses.
FINDINGS: There are no findings suspicious for malignancy.
IMPRESSION: No mammographic evidence of malignancy. A result letter of this
screening mammogram will be mailed directly to the patient.

RECOMMENDATION:
Screening mammogram in one year. (Code:Q3-W-BC3)

BI-RADS CATEGORY  1: Negative.

## 2021-12-11 ENCOUNTER — Encounter: Payer: Self-pay | Admitting: Family Medicine

## 2021-12-11 ENCOUNTER — Other Ambulatory Visit (HOSPITAL_COMMUNITY)
Admission: RE | Admit: 2021-12-11 | Discharge: 2021-12-11 | Disposition: A | Discharge status: Home / Self Care | Payer: BC Managed Care – PPO | Source: Ambulatory Visit | Attending: Family Medicine | Admitting: Family Medicine

## 2021-12-11 ENCOUNTER — Ambulatory Visit (INDEPENDENT_AMBULATORY_CARE_PROVIDER_SITE_OTHER): Payer: Commercial Managed Care - PPO | Admitting: Family Medicine

## 2021-12-11 VITALS — BP 102/72 | Ht 62.0 in | Wt 128.0 lb

## 2021-12-11 DIAGNOSIS — Z8619 Personal history of other infectious and parasitic diseases: Secondary | ICD-10-CM | POA: Diagnosis present

## 2021-12-11 DIAGNOSIS — R635 Abnormal weight gain: Secondary | ICD-10-CM | POA: Diagnosis not present

## 2021-12-11 DIAGNOSIS — Z01419 Encounter for gynecological examination (general) (routine) without abnormal findings: Secondary | ICD-10-CM | POA: Insufficient documentation

## 2021-12-11 NOTE — Patient Instructions (Signed)
You have constipation which is hard stools that are difficult to pass. It is important to have regular bowel movements every 1-3 days that are soft and easy to pass. Hard stools increase your risk of hemorrhoids and are very uncomfortable.   To prevent constipation you can increase the amount of fiber in your diet. Examples of foods with fiber are leafy greens, whole grain breads, oatmeal and other grains.  It is also important to drink at least eight 8oz glass of water everyday.   If you have not has a bowel movement in 4-5 days you made need to clean out your bowel.  This will have establish normal movement through your bowel.    Miralax Clean out Take 8 capfuls of miralax in 64 oz of gatorade. You can use any fluid that appeals to you (gatorade, water, juice) Continue to drink at least eight 8 oz glasses of water throughout the day You can repeat with another 8 capfuls of miralax in 64 oz of gatorade if you are not having a large amount of stools You will need to be at home and close to a bathroom for about 8 hours when you do the above as you may need to go to the bathroom frequently.   After you are cleaned out: - Start Colace100mg  twice daily - Start Miralax once daily-- 1 to 1/2 capful daily - Start a daily fiber supplement like metamucil or citrucel - You can safely use enemas in pregnancy  - if you are having diarrhea you can reduce to Colace once a day or miralax every other day or a 1/2 capful daily.

## 2021-12-11 NOTE — Progress Notes (Signed)
   GYNECOLOGY ANNUAL PREVENTATIVE CARE ENCOUNTER NOTE  Subjective:   Heidi Rodriguez is a 51 y.o. G1P1 female here for a routine annual gynecologic exam.  Current complaints: Constipation and weight gain. Questions about timing of next DEXA.     Patient is s/p hysterectomy about 14 years prior.  Denies abnormal vaginal bleeding, discharge, pelvic pain, problems with intercourse or other gynecologic concerns.    Gynecologic History No LMP recorded (lmp unknown). Patient has had a hysterectomy. Contraception: status post hysterectomy Last Pap: 2022. Results were: normal-- HAD LSIL in 2021 Last mammogram: 2022. Results were: normal  Health Maintenance Due  Topic Date Due   COVID-19 Vaccine (4 - Pfizer series) 12/28/2020    The following portions of the patient's history were reviewed and updated as appropriate: allergies, current medications, past family history, past medical history, past social history, past surgical history and problem list.  Review of Systems Pertinent items are noted in HPI.   Objective:  BP 102/72   Ht 5\' 2"  (1.575 m)   Wt 128 lb (58.1 kg)   LMP  (LMP Unknown)   BMI 23.41 kg/m  CONSTITUTIONAL: Well-developed, well-nourished female in no acute distress.  HENT:  Normocephalic, atraumatic, External right and left ear normal. Oropharynx is clear and moist EYES:  No scleral icterus.  NECK: Normal range of motion, supple, no masses.  Normal thyroid.  SKIN: Skin is warm and dry. No rash noted. Not diaphoretic. No erythema. No pallor. NEUROLOGIC: Alert and oriented to person, place, and time. Normal reflexes, muscle tone coordination. No cranial nerve deficit noted. PSYCHIATRIC: Normal mood and affect. Normal behavior. Normal judgment and thought content. CARDIOVASCULAR: Normal heart rate noted, regular rhythm. 2+ distal pulses. RESPIRATORY: Effort and breath sounds normal, no problems with respiration noted. BREASTS: Symmetric in size. No masses, skin changes,  nipple drainage, or lymphadenopathy. ABDOMEN: Soft,  no distention noted.  No tenderness, rebound or guarding.  PELVIC: Normal appearing external genitalia; normal appearing vaginal mucosa and cervix.  No abnormal discharge noted.  Pap smear obtained.  Normal uterine size, no other palpable masses, no uterine or adnexal tenderness. MUSCULOSKELETAL: Normal range of motion.   Assessment and Plan:  1) Annual gynecologic examination with pap smear:  Will follow up results of pap smear and manage accordingly. STI screening desired No.  Routine preventative health maintenance measures emphasized. Reviewed menopausal symptoms and management.   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 Well woman exam with routine gynecological exam - Cytology - PAP - MM 3D SCREEN BREAST BILATERAL; Future - For constipation reviewed bowel regimen -- she is using a supplement with bowel stimulant but needs stool softener.  Also given Miralax clean out regimen if needed. - DEX A at 15 years post hysterectomy  3. Weight gain - Has thyroid checked at PCP and was WNL in late 2022.  No concern for diabetes as glucose was normal at this time as well.  - Most likely postmenopausal status related.  - Reviewed normality weight gain and encouraged her efforts Wt Readings from Last 3 Encounters:  12/11/21 128 lb (58.1 kg)  04/02/21 129 lb 3.2 oz (58.6 kg)  11/20/20 126 lb (57.2 kg)     Please refer to After Visit Summary for other counseling recommendations.   Return in about 1 year (around 12/12/2022) for Yearly wellness exam.  02/12/2023, MD, MPH, ABFM Attending Physician Center for [facility]

## 2021-12-17 LAB — CYTOLOGY - PAP
Comment: NEGATIVE
High risk HPV: NEGATIVE

## 2021-12-20 NOTE — Progress Notes (Signed)
Pt has apt with Dr Marice Potter on 8/14

## 2022-01-01 ENCOUNTER — Ambulatory Visit: Payer: BC Managed Care – PPO | Admitting: Physician Assistant

## 2022-01-01 ENCOUNTER — Encounter: Payer: Self-pay | Admitting: Physician Assistant

## 2022-01-01 VITALS — BP 107/68 | HR 69 | Temp 98.6°F | Ht 62.01 in | Wt 126.4 lb

## 2022-01-01 DIAGNOSIS — L989 Disorder of the skin and subcutaneous tissue, unspecified: Secondary | ICD-10-CM | POA: Diagnosis not present

## 2022-01-01 DIAGNOSIS — R600 Localized edema: Secondary | ICD-10-CM | POA: Diagnosis not present

## 2022-01-01 HISTORY — PX: COLPOSCOPY: SHX161

## 2022-01-01 NOTE — Progress Notes (Signed)
Acute Office Visit   Patient: Heidi Rodriguez   DOB: [birth year]   51 y.o. Female  MRN: [id number] Visit Date: 01/01/2022  Today's healthcare provider: Oswaldo Conroy Aja Bolander, PA-C  Introduced myself to the patient as a Secondary school teacher and provided education on APPs in clinical practice.    Chief Complaint  Patient presents with   swollen lymphnode    On the right side of neck, started on last Wednesday    Lesion     Has a lesion on left arm near elbow since July 4, has gotten smaller since then but is still there, no pain or itching.    Subjective    HPI HPI     swollen lymphnode    Additional comments: On the right side of neck, started on last Wednesday         Lesion     Additional comments: Has a lesion on left arm near elbow since July 4, has gotten smaller since then but is still there, no pain or itching.       Last edited by Sherolyn Buba, CMA on 01/01/2022  8:20 AM.        Adenopathy States it started last Wed - notes some associated allergy symptoms- drainage and watery, itchy eyes Thurs Right side, swollen along the right jawline with pain radiating to the mid chin States she has been taking Advil every 4 hours and hot compresses  Reports moderate improvement  States her husband has noted new "bad breath" since this started   Lesion on left elbow Reports erythematous raised lesion on left elbow Denies itching or pain along the area    Medications: Outpatient Medications Prior to Visit  Medication Sig   azelastine (ASTELIN) 0.1 % nasal spray Place 2 sprays into both nostrils 2 (two) times daily. Use in each nostril as directed   Biotin 10 MG CAPS Take by mouth.   calcium carbonate 1250 MG capsule Take 1,250 mg by mouth daily.   Cholecalciferol (VITAMIN D3) 2000 units capsule Take by mouth.   estradiol (ESTRACE VAGINAL) 0.1 MG/GM vaginal cream 1 gram vaginally twice weekly   estradiol (ESTRACE) 0.1 MG/GM vaginal cream 1 g daily for 2 weeks, then decrease  1 tab 2x a week   Lactobacillus Acid-Pectin (ACIDOPHILUS/PECTIN) CAPS Take by mouth.   Multiple Vitamin (MULTIVITAMIN) capsule Take 1 capsule by mouth daily.   Multiple Vitamins-Minerals (ZINC PO) Take by mouth daily.   NAFTIN 2 % GEL APPLY DAILY TO AFFECTED AREA(S) PLUS A 0.5 INCH MARGIN TO HEALTHY, SURROUNDING SKIN FOR 2 WEEKS   OPZELURA 1.5 % CREA Apply 1 application topically 2 (two) times daily.   polyethylene glycol (MIRALAX / GLYCOLAX) 17 g packet Take 17 g by mouth daily.   ranitidine (ZANTAC) 75 MG tablet Take 75 mg by mouth daily as needed for heartburn.   spironolactone (ALDACTONE) 50 MG tablet 2(TWO) TABLET(S) ORAL EVERY DAY   TURMERIC PO Take by mouth daily.   valACYclovir (VALTREX) 500 MG tablet Take 1 tablet (500 mg total) by mouth daily.   vitamin C (ASCORBIC ACID) 500 MG tablet Take by mouth.   No facility-administered medications prior to visit.    Review of Systems  HENT:  Negative for trouble swallowing and voice change.   Skin:  Positive for rash.  Hematological:  Positive for adenopathy.       Objective    BP 107/68   Pulse 69   Temp 98.6 F (  37 C) (Oral)   Ht 5' 2.01" (1.575 m)   Wt 126 lb 6.4 oz (57.3 kg)   LMP  (LMP Unknown)   SpO2 97%   BMI 23.11 kg/m    Physical Exam Vitals reviewed.  Constitutional:      General: She is awake.     Appearance: Normal appearance. She is well-developed, well-groomed and normal weight.  HENT:     Head: Normocephalic and atraumatic.     Nose: Nose normal.     Mouth/Throat:     Mouth: Mucous membranes are moist.     Pharynx: No oropharyngeal exudate or posterior oropharyngeal erythema.  Neck:   Musculoskeletal:     Cervical back: Full passive range of motion without pain, normal range of motion and neck supple. No pain with movement. Normal range of motion.  Lymphadenopathy:     Head:     Right side of head: No submental, submandibular, tonsillar or preauricular adenopathy.     Left side of head: No  submental, submandibular, tonsillar or preauricular adenopathy.     Cervical:     Right cervical: No superficial cervical adenopathy.    Upper Body:     Right upper body: No supraclavicular adenopathy.     Left upper body: No supraclavicular adenopathy.  Neurological:     General: No focal deficit present.     Mental Status: She is alert and oriented to person, place, and time.     GCS: GCS eye subscore is 4. GCS verbal subscore is 5. GCS motor subscore is 6.     Cranial Nerves: No cranial nerve deficit, dysarthria or facial asymmetry.  Psychiatric:        Attention and Perception: Attention and perception normal.        Mood and Affect: Mood and affect normal.        Speech: Speech normal.        Behavior: Behavior normal. Behavior is cooperative.       No results found for any visits on 01/01/22.  Assessment & Plan      No follow-ups on file.     Problem List Items Addressed This Visit   None Visit Diagnoses     Salivary gland swelling    -  Primary Acute, new concern Reports swelling and pain along right jaw last week that has started to improve with warm compresses and Advil Reviewed photos from last week and there is significant decrease to swelling today Suspect this may be an enlarged submandibular salivary gland as compared to lymphadenopathy  Small white substance noted at opening of right Wharton's duct which could correlate to stone Recommend she continue with warm compresses and use sialogogues such as sour candies to help facilitate salivary flow She reports she has a dental apt upcoming and will have them eval as well if still having concerns Will send for ENT if she is still not having resolution in the next 4-6 weeks Follow up as needed for progressing or persistent symptoms     Skin lesion of left arm     Acute, new problem Reports she noticed a papulovesicular lesion along her left posterior elbow States it is improving and denies itching or  pain Suspect potential insect bite or isolated contact dermatitis  Area looks overall papular today with mild redness  Recommend gentle cleansing and avoidance of touching the area If it does not continue to improve we can seek out assistance from [facility]  No follow-ups on file.   I, Malyna Budney E Cristine Daw, PA-C, have reviewed all documentation for this visit. The documentation on 01/01/22 for the exam, diagnosis, procedures, and orders are all accurate and complete.   Jacquelin Hawking, MHS, PA-C [facility]

## 2022-01-01 NOTE — Patient Instructions (Signed)
I believe that you have a salivary stone obstruction that is causing the swelling and discomfort This can be treated in the following ways Warm compresses Advil and tylenol for pain Things that stimulate salivation such as sour candies or sour fruits Gentle massage from the jaw to the chin may also help move the stone a bit   If it is not improving with these measures over the next few weeks let us know and we can place a referral to ENT.

## 2022-01-03 ENCOUNTER — Ambulatory Visit
Admission: RE | Admit: 2022-01-03 | Discharge: 2022-01-03 | Disposition: A | Discharge status: Home / Self Care | Payer: BC Managed Care – PPO | Source: Ambulatory Visit | Attending: Family Medicine | Admitting: Family Medicine

## 2022-01-03 DIAGNOSIS — Z01419 Encounter for gynecological examination (general) (routine) without abnormal findings: Secondary | ICD-10-CM

## 2022-01-03 DIAGNOSIS — Z1231 Encounter for screening mammogram for malignant neoplasm of breast: Secondary | ICD-10-CM | POA: Diagnosis present

## 2022-01-14 ENCOUNTER — Ambulatory Visit (INDEPENDENT_AMBULATORY_CARE_PROVIDER_SITE_OTHER): Payer: BC Managed Care – PPO | Admitting: Obstetrics & Gynecology

## 2022-01-14 ENCOUNTER — Encounter: Payer: Self-pay | Admitting: Obstetrics & Gynecology

## 2022-01-14 VITALS — BP 120/80 | Ht 62.0 in | Wt 127.0 lb

## 2022-01-14 DIAGNOSIS — R87612 Low grade squamous intraepithelial lesion on cytologic smear of cervix (LGSIL): Secondary | ICD-10-CM

## 2022-01-14 DIAGNOSIS — E8940 Asymptomatic postprocedural ovarian failure: Secondary | ICD-10-CM

## 2022-01-14 DIAGNOSIS — Z8262 Family history of osteoporosis: Secondary | ICD-10-CM | POA: Diagnosis not present

## 2022-01-14 NOTE — Progress Notes (Signed)
   Subjective:    Patient ID: Heidi Rodriguez, female    DOB: 1971-05-11, 51 y.o.   MRN: [id number]  HPI 51 yo married P1 (delivered at age 51, didn't raise the baby) here for a colpo. She had a LGSIL pap 12/2021.  She has a h/o abnormal pap smears since about age 47. Per her recollection, she has had at least 3 cryo treatments, LEEP x 2. Her pap history is as follows:  10/26/03 ASCUS 05/20/05 NIL 06/11/07 NIL 07/11/08 NIL  07/13/09 NIL HPV negative 08/17/09 Supracervical hysterectomy and BSO (for dermoid) 07/16/10 NIL 07/20/12 NIL HPV positive 08/03/13 NIL HPV negative 08/08/14 LSIL HPV negative 08/23/14 Colposcopy VAIN I 02/16/15 LSIL HPV negative 08/15/15 LSIL HPV negative 08/24/15 Colposcopy VAIN I 09/22/15 OR excision VAIN I left sidewall, ECC CIN I, 12 O'Clock ectocervix CIN I 09/11/16 ASCUS 10/17/16 Colposcopy negative 09/16/17 NIL HPV negative    Review of Systems     Objective:   Physical Exam  Well nourished, well hydrated White female, no apparent distress She is ambulating and conversing normally. UPT negative, consent signed, time out done Cervix prepped with acetic acid. Transformation zone seen in its entirety. Colpo adequate. Her cervix is almost flush with the vagina.  Her cervix is very stenotic. I could not see the transition zone. I was unable to obtain an ECC so I sprayed her cervix with lidocaine and grasped it with a single tooth tenaculum. I then attempted to dilate the cervix but could not even do this (the plastic dilator kept bending).  She tolerated the procedure well.     Assessment & Plan:   Inadequate colpo with long h/o abnormal paps, most recently LGSIL. I will refer her to gyn onc for management options.

## 2022-01-15 ENCOUNTER — Encounter: Payer: Self-pay | Admitting: Obstetrics & Gynecology

## 2022-01-16 ENCOUNTER — Telehealth: Payer: Self-pay | Admitting: *Deleted

## 2022-01-16 NOTE — Telephone Encounter (Signed)
Attempted to reach the patient to schedule a new patient appt with Dr Tucker; LMOM to call the office back  

## 2022-01-16 NOTE — Telephone Encounter (Signed)
Spoke with the patient regarding the referral to GYN oncology. Patient scheduled as new patient with Dr Pricilla Holm on 9/29 at 9 am.  Patient given an arrival time of 8:30 am.  Explained to the patient the the doctor will perform a pelvic exam at this visit. Patient given the policy that no visitors under the 16 yrs are allowed in the Cancer Center. Patient given the address/phone number for the clinic and that the center offers free valet service.

## 2022-02-01 HISTORY — PX: COLPOSCOPY VULVA W/ BIOPSY: SUR282

## 2022-02-04 HISTORY — PX: SALIVARY STONE REMOVAL: SHX5213

## 2022-02-15 ENCOUNTER — Telehealth: Payer: Self-pay

## 2022-02-15 NOTE — Telephone Encounter (Signed)
Patient called back and moved appt from 9/29 to 9/19

## 2022-02-15 NOTE — Telephone Encounter (Signed)
Voicemail left for patient to return call. Pt is scheduled with Dr.Tucker on 9/29. An opening has become available for 9/18 or 9/19 with Dr.Newton.

## 2022-02-19 ENCOUNTER — Other Ambulatory Visit: Payer: Self-pay

## 2022-02-19 ENCOUNTER — Encounter: Payer: Self-pay | Admitting: Psychiatry

## 2022-02-19 ENCOUNTER — Inpatient Hospital Stay: Payer: BC Managed Care – PPO | Attending: Gynecologic Oncology | Admitting: Psychiatry

## 2022-02-19 VITALS — BP 108/68 | HR 74 | Temp 98.0°F | Resp 14 | Ht 62.0 in | Wt 126.0 lb

## 2022-02-19 DIAGNOSIS — N890 Mild vaginal dysplasia: Secondary | ICD-10-CM

## 2022-02-19 DIAGNOSIS — R87612 Low grade squamous intraepithelial lesion on cytologic smear of cervix (LGSIL): Secondary | ICD-10-CM | POA: Insufficient documentation

## 2022-02-19 MED ORDER — ESTRADIOL 0.1 MG/GM VA CREA: TOPICAL_CREAM | VAGINAL | 3 refills | Status: DC

## 2022-02-19 NOTE — Progress Notes (Addendum)
GYNECOLOGIC ONCOLOGY NEW PATIENT CONSULTATION  Date of Service: 02/19/2022 Referring Provider: Clovia Cuff, MD   ASSESSMENT AND PLAN: Heidi Rodriguez is a 51 y.o. 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  Of records available to [place], all Pap smears and biopsies have been low-grade.  Patient does however report a prior LEEP procedure and cryotherapy so it is possible that she has had high-grade lesion previously.  Discussed with patient that in the setting of HPV negative and low-grade lesions, would not recommend more invasive measures like a trachelectomy, or other treatments like laser or 5-FU at this time.  Given that patient has been in surgical menopause since 2011 without systemic hormone replacement therapy, discussed that changes noted on Pap smears could be impacted by her atrophic vaginal mucosa.  Patient has used vaginal estrogen at times in the past, but reports that she does not use this regularly and has not used in many months.  Recommend regular treatment with vaginal estrogen cream.  Discussed nightly x2 weeks followed by 2-3 nights per week.  Also recommended vaginal moisturizing with coconut oil on nights when she is not using vaginal estrogen cream.  One area on exam noted today of the vaginal mucosa which may mostly represent atrophic inflamed mucosa.  Biopsy obtained to ensure that this does not represent dysplasia given decrease staining with Lugol's solution in this area.  If biopsy negative for dysplasia, discussed with patient that we may be able to safely space out her Pap smears to repeat in 3 years.  If low-grade changes on biopsy, would repeat in 1 year.  We will have patient return in 3 months for follow-up of initiation of vaginal estrogen therapy.  A copy of this note was sent to the patient's referring provider.  Bernadene Bell, MD Gynecologic Oncology   Medical Decision Making I personally spent  TOTAL 45 minutes face-to-face and non-face-to-face in the care of  this patient, which includes all pre, intra, and post visit time on the date of service.  5 minutes spent reviewing records prior to the visit 30 Minutes in patient contact      5 minutes in other billable services 5 minutes charting , conferring with consultants etc.   ------------  CC: LSIL  HISTORY OF PRESENT ILLNESS:  Heidi Rodriguez is a 51 y.o. woman who is seen in consultation at the request of Clovia Cuff, MD for evaluation of L SIL.  Patient reports a history of abnormal Pap smears dating back to her teenage years.  She reports undergoing a LEEP around age 66 and prior cryotherapy of her cervix.  She is unsure if she has ever had high risk Pap or biopsy.  She underwent a supracervical hysterectomy and bilateral salpingo-oophorectomy in 2011 for dermoid cysts of the ovaries.  She reports that she was not placed on systemic hormone replacement therapy, but she has used vaginal estrogen at times since her hysterectomy.  She last used for about 1 or 2 months about a year ago but has not been consistently using since that time.  Below is her more recent history of Pap smears and biopsies since 2005, with no HSIL or CIN2-3.  She had 1 positive HPV in 2014, but her HPV has been negative since.  On most recent colposcopy in August, transformation zone was unable to be visualized and an ECC was not able to be performed.  TREATMENT HISTORY: 10/26/03 ASCUS 05/20/05 NIL 06/11/07 NIL 07/11/08 NIL  07/13/09 NIL HPV negative 08/17/09 Supracervical hysterectomy and BSO (  for dermoid) 07/16/10 NIL 07/20/12 NIL HPV positive 08/03/13 NIL HPV negative 08/08/14 LSIL HPV negative 08/23/14 Colposcopy VAIN I 02/16/15 LSIL HPV negative 08/15/15 LSIL HPV negative 08/24/15 Colposcopy VAIN I 09/22/15 OR excision VAIN I left sidewall, ECC CIN I, 12 O'Clock ectocervix CIN I 09/11/16 ASCUS 10/17/16 Colposcopy negative 09/16/17 NIL HPV negative 12/2021 LSIL HPV negative 01/14/2022 colposcopy inadequate, no biopsies  PAST  MEDICAL HISTORY: Past Medical History:  Diagnosis Date   Abnormal cytology    CIN I (cervical intraepithelial neoplasia I)    GERD (gastroesophageal reflux disease)    Herpes genitalis    PONV (postoperative nausea and vomiting)    Vulvar intraepithelial neoplasia (VIN) grade 1     PAST SURGICAL HISTORY: Past Surgical History:  Procedure Laterality Date   COLONOSCOPY WITH PROPOFOL N/A 05/25/2020   Procedure: COLONOSCOPY WITH PROPOFOL;  Surgeon: Pasty Spillers, MD;  Location: [facility];  Service: Endoscopy;  Laterality: N/A;   COLPOSCOPY N/A 09/22/2015   Procedure: COLPOSCOPY;  Surgeon: Vena Austria, MD;  Location: [facility] ORS;  Service: Gynecology;  Laterality: N/A;   EYE SURGERY     LESION REMOVAL N/A 09/22/2015   Procedure: EXCISION VAGINAL LESION;  Surgeon: Vena Austria, MD;  Location: [facility] ORS;  Service: Gynecology;  Laterality: N/A;   TOTAL ABDOMINAL HYSTERECTOMY W/ BILATERAL SALPINGOOPHORECTOMY      OB/GYN HISTORY: OB History  Gravida Para Term Preterm AB Living  1 1       1   SAB IAB Ectopic Multiple Live Births               # Outcome Date GA Lbr Len/2nd Weight Sex Delivery Anes PTL Lv  1 Para               Age at menarche: 38 Age at menopause: 67, surgical menopause Hx of HRT: Vaginal estrogen but no systemic therapy Hx of STI: HPV Last pap: L SIL, HPV - 12/2021 History of abnormal pap smears: Yes see above  SCREENING STUDIES:  Last mammogram: July 2023 Last colonoscopy: 2022  MEDICATIONS:  Current Outpatient Medications:    azelastine (ASTELIN) 0.1 % nasal spray, Place 2 sprays into both nostrils 2 (two) times daily. Use in each nostril as directed, Disp: 30 mL, Rfl: 12   Biotin 10 MG CAPS, Take by mouth., Disp: , Rfl:    calcium carbonate 1250 MG capsule, Take 1,250 mg by mouth daily., Disp: , Rfl:    Cholecalciferol (VITAMIN D3) 2000 units capsule, Take by mouth., Disp: , Rfl:    Lactobacillus Acid-Pectin (ACIDOPHILUS/PECTIN) CAPS, Take  by mouth., Disp: , Rfl:    Multiple Vitamin (MULTIVITAMIN) capsule, Take 1 capsule by mouth daily., Disp: , Rfl:    Multiple Vitamins-Minerals (ZINC PO), Take by mouth daily., Disp: , Rfl:    NAFTIN 2 % GEL, APPLY DAILY TO AFFECTED AREA(S) PLUS A 0.5 INCH MARGIN TO HEALTHY, SURROUNDING SKIN FOR 2 WEEKS, Disp: 60 g, Rfl: 2   OPZELURA 1.5 % CREA, Apply 1 application topically 2 (two) times daily., Disp: , Rfl:    polyethylene glycol (MIRALAX / GLYCOLAX) 17 g packet, Take 17 g by mouth daily., Disp: , Rfl:    ranitidine (ZANTAC) 75 MG tablet, Take 75 mg by mouth daily as needed for heartburn., Disp: , Rfl:    spironolactone (ALDACTONE) 50 MG tablet, 2(TWO) TABLET(S) ORAL EVERY DAY, Disp: 180 tablet, Rfl: 3   TURMERIC PO, Take by mouth daily., Disp: , Rfl:    valACYclovir (VALTREX) 500 MG tablet, Take  1 tablet (500 mg total) by mouth daily., Disp: 90 tablet, Rfl: 3   vitamin C (ASCORBIC ACID) 500 MG tablet, Take by mouth., Disp: , Rfl:    doxycycline (VIBRAMYCIN) 100 MG capsule, Take 100 mg by mouth 2 (two) times daily., Disp: , Rfl:    estradiol (ESTRACE VAGINAL) 0.1 MG/GM vaginal cream, 1 gram vaginally twice weekly, Disp: 42.5 g, Rfl: 3  ALLERGIES: Allergies  Allergen Reactions   Erythromycin Hives   Keflex [Cephalexin] Hives   Penicillin G     Other reaction(s): Unknown    FAMILY HISTORY: Family History  Problem Relation Age of Onset   Skin cancer Mother 4030   Glaucoma Mother    Diabetes Maternal Uncle    Skin cancer Maternal Grandmother    Leukemia Maternal Grandmother    CAD Maternal Grandmother    Glaucoma Maternal Grandmother    CAD Father    Hyperlipidemia Father    Heart disease Father    Atrial fibrillation Father    Obesity Sister    Osteoarthritis Sister    Asthma Sister    Breast cancer Paternal Aunt [phone]   Cancer Paternal Grandmother     SOCIAL HISTORY: Social History   Socioeconomic History   Marital status: Married    Spouse name: Not on file   Number of  children: Not on file   Years of education: Not on file   Highest education level: Not on file  Occupational History   Not on file  Tobacco Use   Smoking status: Never   Smokeless tobacco: Never  Vaping Use   Vaping Use: Never used  Substance and Sexual Activity   Alcohol use: No   Drug use: No   Sexual activity: Yes    Birth control/protection: Surgical    Comment: Hysterectomy  Other Topics Concern   Not on file  Social History Narrative   Not on file   Social Determinants of Health   Financial Resource Strain: Not on file  Food Insecurity: Not on file  Transportation Needs: Not on file  Physical Activity: Not on file  Stress: Not on file  Social Connections: Not on file  Intimate Partner Violence: Not on file    REVIEW OF SYSTEMS: New patient intake form was reviewed.  Complete 10-system review is negative except for the following: Constipation, rash, bloating, pain with intercourse  PHYSICAL EXAM: BP 108/68 (BP Location: Right Arm, Patient Position: Sitting)   Pulse 74   Temp 98 F (36.7 C) (Oral)   Resp 14   Ht 5\' 2"  (1.575 m)   Wt 126 lb (57.2 kg)   LMP  (LMP Unknown)   SpO2 100%   BMI 23.05 kg/m  Constitutional: No acute distress. Neuro/Psych: Alert, oriented.  Head and Neck: Normocephalic, atraumatic. Neck symmetric without masses. Sclera anicteric.  Respiratory: Normal work of breathing.  Abdomen: Soft, non-distended, non-tender to palpation. Extremities: Grossly normal range of motion. Warm, well perfused. No edema bilaterally. Skin: No rashes or lesions. Lymphatic: No inguinal adenopathy. Genitourinary: External genitalia without lesions. Urethral meatus without lesions or prolapse. On speculum exam, atrophic vagina and cervix.  Cervix flush with vagina but without lesion.  Raw erythematous vaginal mucosa of the right posterior vaginal fornix.    COLPOSCOPY PROCEDURE NOTE  Procedure Details: Cervical and vaginal colposcopy.  Patient placed in  dorsolithotomy position.  Speculum inserted to the vagina.  Acetic acid applied followed by Lugol's solution.  Betadine solution applied and biopsy obtained of the vaginal mucosa using Tischler  forceps.  Hemostasis was obtained with silver nitrate.  Adequate Exam: Cervical transformation zone not visualized  Biopsy Specimen: Vaginal biopsy  Condition: Stable. Patient tolerated procedure well.  Complications: None  Findings: No acetowhite changes of the cervix.  Decreased uptake of Lugol's solution of vaginal mucosa adjacent to the approximately 7-8 o'clock.  Colposcopic Impression: VAIN1   LABORATORY AND RADIOLOGIC DATA: Outside medical records were reviewed to synthesize the above history, along with the history and physical obtained during the visit.  Outside laboratory, pathology reports were reviewed, with pertinent results below.    WBC  Date Value Ref Range Status  04/02/2021 6.7 3.4 - 10.8 x10E3/uL Final   Hemoglobin  Date Value Ref Range Status  04/02/2021 14.7 11.1 - 15.9 g/dL Final   Hematocrit  Date Value Ref Range Status  04/02/2021 43.6 34.0 - 46.6 % Final   Platelets  Date Value Ref Range Status  04/02/2021 237 150 - 450 x10E3/uL Final   Creatinine, Ser  Date Value Ref Range Status  04/02/2021 0.86 0.57 - 1.00 mg/dL Final   AST  Date Value Ref Range Status  04/02/2021 20 0 - 40 IU/L Final   ALT  Date Value Ref Range Status  04/02/2021 29 0 - 32 IU/L Final   HPV  Date Value Ref Range Status  11/12/2018 NOT DETECTED  Final    Comment:    Normal Reference Range - NOT Detected    Diagnosis  Date Value Ref Range Status  12/11/2021 - Low grade squamous intraepithelial lesion (LSIL) (A)  Final

## 2022-02-19 NOTE — Patient Instructions (Addendum)
It was a pleasure to see you in clinic today. -Biopsy taken today.  Will let you know of the results. -We discussed starting with treatment including vaginal estrogen cream and vaginal moisturizer with coconut oil.  I recommend that you use the vaginal estrogen cream nightly for the first 2 weeks.  And then you can space out to 2-3 nights a week.  You can use the coconut oil on nights when you are not using the vaginal estrogen cream. -Pending the biopsy results, we may be able to taste out your Pap smear to 3 years for now.  Will let you know final decision regarding that once we have the biopsy back. - Return visit planned for 3 months to see how you are tolerating treatment.  Thank you very much for allowing me to provide care for you today.  I appreciate your confidence in choosing our Gynecologic Oncology team at [facility].  If you have any questions about your visit today please call our office or send [place] a MyChart message and we will get back to you as soon as possible.

## 2022-02-25 LAB — SURGICAL PATHOLOGY

## 2022-02-26 ENCOUNTER — Ambulatory Visit
Admission: RE | Admit: 2022-02-26 | Discharge: 2022-02-26 | Disposition: A | Discharge status: Home / Self Care | Payer: BC Managed Care – PPO | Source: Ambulatory Visit | Attending: Obstetrics & Gynecology | Admitting: Obstetrics & Gynecology

## 2022-02-26 DIAGNOSIS — E8940 Asymptomatic postprocedural ovarian failure: Secondary | ICD-10-CM | POA: Insufficient documentation

## 2022-02-26 DIAGNOSIS — Z8262 Family history of osteoporosis: Secondary | ICD-10-CM | POA: Diagnosis present

## 2022-03-01 ENCOUNTER — Ambulatory Visit: Payer: Commercial Managed Care - PPO | Admitting: Gynecologic Oncology

## 2022-04-03 ENCOUNTER — Encounter: Payer: Self-pay | Admitting: Family Medicine

## 2022-04-03 ENCOUNTER — Ambulatory Visit (INDEPENDENT_AMBULATORY_CARE_PROVIDER_SITE_OTHER): Payer: BC Managed Care – PPO | Admitting: Family Medicine

## 2022-04-03 ENCOUNTER — Other Ambulatory Visit: Payer: Self-pay | Admitting: Family Medicine

## 2022-04-03 VITALS — BP 110/77 | HR 76 | Temp 98.0°F | Ht 62.0 in | Wt 125.2 lb

## 2022-04-03 DIAGNOSIS — R87619 Unspecified abnormal cytological findings in specimens from cervix uteri: Secondary | ICD-10-CM | POA: Diagnosis not present

## 2022-04-03 DIAGNOSIS — Z0000 Encounter for general adult medical examination without abnormal findings: Secondary | ICD-10-CM | POA: Diagnosis not present

## 2022-04-03 DIAGNOSIS — K5909 Other constipation: Secondary | ICD-10-CM

## 2022-04-03 LAB — URINALYSIS, ROUTINE W REFLEX MICROSCOPIC
Bilirubin, UA: NEGATIVE
Glucose, UA: NEGATIVE
Ketones, UA: NEGATIVE
Leukocytes,UA: NEGATIVE
Nitrite, UA: NEGATIVE
Protein,UA: NEGATIVE
RBC, UA: NEGATIVE
Specific Gravity, UA: 1.015 (ref 1.005–1.030)
Urobilinogen, Ur: 0.2 mg/dL (ref 0.2–1.0)
pH, UA: 7.5 (ref 5.0–7.5)

## 2022-04-03 MED ORDER — VALACYCLOVIR HCL 500 MG PO TABS: 500.0000 mg | ORAL_TABLET | Freq: Every day | ORAL | 3 refills | Status: DC

## 2022-04-03 MED ORDER — AZELASTINE HCL 0.1 % NA SOLN: 2.0000 | Freq: Two times a day (BID) | NASAL | 12 refills | Status: DC

## 2022-04-03 NOTE — Assessment & Plan Note (Signed)
Continue to follow with GYN. 

## 2022-04-03 NOTE — Progress Notes (Signed)
BP 110/77   Pulse 76   Temp 98 F (36.7 C)   Ht 5\' 2"  (1.575 m)   Wt 125 lb 3.2 oz (56.8 kg)   LMP  (LMP Unknown)   SpO2 97%   BMI 22.90 kg/m    Subjective:    Patient ID: , female    DOB: 1970/12/11, 51 y.o.   MRN: [id number]  HPI: Heidi Rodriguez is a 51 y.o. female presenting on 04/03/2022 for comprehensive medical examination. Current medical complaints include:none  She currently lives with: husband and child Menopausal Symptoms: yes  Depression Screen done today and results listed below:     04/03/2022    8:25 AM 01/01/2022    8:30 AM 06/01/2021    9:24 AM 04/02/2021    9:14 AM 03/31/2020    8:09 AM  Depression screen PHQ 2/9  Decreased Interest 0 0 0 0 0  Down, Depressed, Hopeless 0 0 0 0 0  PHQ - 2 Score 0 0 0 0 0  Altered sleeping 0 0 0 0   Tired, decreased energy 0 0 0 0   Change in appetite 0 0 0 0   Feeling bad or failure about yourself  0 0 0 0   Trouble concentrating 0 0 0 0   Moving slowly or fidgety/restless 0 0 0 0   Suicidal thoughts 0 0 0 0   PHQ-9 Score 0 0 0 0   Difficult doing work/chores  Not difficult at all       Past Medical History:  Past Medical History:  Diagnosis Date   Abnormal cytology    CIN I (cervical intraepithelial neoplasia I)    GERD (gastroesophageal reflux disease)    Herpes genitalis    PONV (postoperative nausea and vomiting)    Vulvar intraepithelial neoplasia (VIN) grade 1     Surgical History:  Past Surgical History:  Procedure Laterality Date   COLONOSCOPY WITH PROPOFOL N/A 05/25/2020   Procedure: COLONOSCOPY WITH PROPOFOL;  Surgeon: 05/27/2020, MD;  Location: [facility];  Service: Endoscopy;  Laterality: N/A;   COLPOSCOPY N/A 09/22/2015   Procedure: COLPOSCOPY;  Surgeon: 09/24/2015, MD;  Location: [facility];  Service: Gynecology;  Laterality: N/A;   COLPOSCOPY  01/01/2022   COLPOSCOPY VULVA W/ BIOPSY  02/01/2022   EYE SURGERY     LESION REMOVAL N/A 09/22/2015   Procedure:  EXCISION VAGINAL LESION;  Surgeon: 09/24/2015, MD;  Location: [facility];  Service: Gynecology;  Laterality: N/A;   SALIVARY STONE REMOVAL  02/04/2022   TOTAL ABDOMINAL HYSTERECTOMY W/ BILATERAL SALPINGOOPHORECTOMY      Medications:  Current Outpatient Medications on File Prior to Visit  Medication Sig   Biotin 10 MG CAPS Take by mouth.   calcium carbonate 1250 MG capsule Take 1,250 mg by mouth daily.   Cholecalciferol (VITAMIN D3) 2000 units capsule Take by mouth.   estradiol (ESTRACE VAGINAL) 0.1 MG/GM vaginal cream 1 gram vaginally twice weekly   Lactobacillus Acid-Pectin (ACIDOPHILUS/PECTIN) CAPS Take by mouth.   Multiple Vitamin (MULTIVITAMIN) capsule Take 1 capsule by mouth daily.   Multiple Vitamins-Minerals (ZINC PO) Take by mouth daily.   NAFTIN 2 % GEL APPLY DAILY TO AFFECTED AREA(S) PLUS A 0.5 INCH MARGIN TO HEALTHY, SURROUNDING SKIN FOR 2 WEEKS   OPZELURA 1.5 % CREA Apply 1 application topically 2 (two) times daily.   polyethylene glycol (MIRALAX / GLYCOLAX) 17 g packet Take 17 g by mouth daily.   ranitidine (ZANTAC) 75 MG tablet Take  75 mg by mouth daily as needed for heartburn.   spironolactone (ALDACTONE) 50 MG tablet 2(TWO) TABLET(S) ORAL EVERY DAY   TURMERIC PO Take by mouth daily.   vitamin C (ASCORBIC ACID) 500 MG tablet Take by mouth.   No current facility-administered medications on file prior to visit.    Allergies:  Allergies  Allergen Reactions   Erythromycin Hives   Keflex [Cephalexin] Hives   Penicillin G     Other reaction(s): Unknown    Social History:  Social History   Socioeconomic History   Marital status: Married    Spouse name: Not on file   Number of children: Not on file   Years of education: Not on file   Highest education level: Not on file  Occupational History   Not on file  Tobacco Use   Smoking status: Never   Smokeless tobacco: Never  Vaping Use   Vaping Use: Never used  Substance and Sexual Activity   Alcohol use: No    Drug use: No   Sexual activity: Yes    Birth control/protection: Surgical    Comment: Hysterectomy  Other Topics Concern   Not on file  Social History Narrative   Not on file   Social Determinants of Health   Financial Resource Strain: Not on file  Food Insecurity: Not on file  Transportation Needs: Not on file  Physical Activity: Not on file  Stress: Not on file  Social Connections: Not on file  Intimate Partner Violence: Not on file   Social History   Tobacco Use  Smoking Status Never  Smokeless Tobacco Never   Social History   Substance and Sexual Activity  Alcohol Use No    Family History:  Family History  Problem Relation Age of Onset   Skin cancer Mother 36   Glaucoma Mother    Diabetes Maternal Uncle    Skin cancer Maternal Grandmother    Leukemia Maternal Grandmother    CAD Maternal Grandmother    Glaucoma Maternal Grandmother    CAD Father    Hyperlipidemia Father    Heart disease Father    Atrial fibrillation Father    Obesity Sister    Osteoarthritis Sister    Asthma Sister    Breast cancer Paternal Aunt 11   Cancer Paternal Grandmother     Past medical history, surgical history, medications, allergies, family history and social history reviewed with patient today and changes made to appropriate areas of the chart.   Review of Systems  Constitutional: Negative.   HENT: Negative.         Still having issues salivary stones   Eyes: Negative.   Respiratory: Negative.    Cardiovascular: Negative.   Gastrointestinal:  Positive for constipation. Negative for abdominal pain, blood in stool, diarrhea, heartburn, melena, nausea and vomiting.  Genitourinary: Negative.   Musculoskeletal:  Positive for back pain. Negative for falls, joint pain, myalgias and neck pain.  Skin: Negative.   Neurological: Negative.   Endo/Heme/Allergies:  Positive for environmental allergies and polydipsia. Does not bruise/bleed easily.  Psychiatric/Behavioral:  Negative.     All other ROS negative except what is listed above and in the HPI.      Objective:    BP 110/77   Pulse 76   Temp 98 F (36.7 C)   Ht 5\' 2"  (1.575 m)   Wt 125 lb 3.2 oz (56.8 kg)   LMP  (LMP Unknown)   SpO2 97%   BMI 22.90 kg/m   Wt  Readings from Last 3 Encounters:  04/03/22 125 lb 3.2 oz (56.8 kg)  02/19/22 126 lb (57.2 kg)  01/14/22 127 lb (57.6 kg)    Physical Exam Vitals and nursing note reviewed.  Constitutional:      General: She is not in acute distress.    Appearance: Normal appearance. She is normal weight. She is not ill-appearing, toxic-appearing or diaphoretic.  HENT:     Head: Normocephalic and atraumatic.     Right Ear: Tympanic membrane, ear canal and external ear normal. There is no impacted cerumen.     Left Ear: Tympanic membrane, ear canal and external ear normal. There is no impacted cerumen.     Nose: Nose normal. No congestion or rhinorrhea.     Mouth/Throat:     Mouth: Mucous membranes are moist.     Pharynx: Oropharynx is clear. No oropharyngeal exudate or posterior oropharyngeal erythema.  Eyes:     General: No scleral icterus.       Right eye: No discharge.        Left eye: No discharge.     Extraocular Movements: Extraocular movements intact.     Conjunctiva/sclera: Conjunctivae normal.     Pupils: Pupils are equal, round, and reactive to light.  Neck:     Vascular: No carotid bruit.  Cardiovascular:     Rate and Rhythm: Normal rate and regular rhythm.     Pulses: Normal pulses.     Heart sounds: No murmur heard.    No friction rub. No gallop.  Pulmonary:     Effort: Pulmonary effort is normal. No respiratory distress.     Breath sounds: Normal breath sounds. No stridor. No wheezing, rhonchi or rales.  Chest:     Chest wall: No tenderness.  Abdominal:     General: Abdomen is flat. Bowel sounds are normal. There is no distension.     Palpations: Abdomen is soft. There is no mass.     Tenderness: There is no abdominal  tenderness. There is no right CVA tenderness, left CVA tenderness, guarding or rebound.     Hernia: No hernia is present.  Genitourinary:    Comments: Breast and pelvic exams deferred with shared decision making Musculoskeletal:        General: No swelling, tenderness, deformity or signs of injury.     Cervical back: Normal range of motion and neck supple. No rigidity. No muscular tenderness.     Right lower leg: No edema.     Left lower leg: No edema.  Lymphadenopathy:     Cervical: No cervical adenopathy.  Skin:    General: Skin is warm and dry.     Capillary Refill: Capillary refill takes less than 2 seconds.     Coloration: Skin is not jaundiced or pale.     Findings: No bruising, erythema, lesion or rash.  Neurological:     General: No focal deficit present.     Mental Status: She is alert and oriented to person, place, and time. Mental status is at baseline.     Cranial Nerves: No cranial nerve deficit.     Sensory: No sensory deficit.     Motor: No weakness.     Coordination: Coordination normal.     Gait: Gait normal.     Deep Tendon Reflexes: Reflexes normal.  Psychiatric:        Mood and Affect: Mood normal.        Behavior: Behavior normal.        Thought  Content: Thought content normal.        Judgment: Judgment normal.     Results for orders placed or performed in visit on 02/19/22  Surgical pathology  Result Value Ref Range   SURGICAL PATHOLOGY      SURGICAL PATHOLOGY CASE: [id number] PATIENT: Heidi Rodriguez Surgical Pathology Report     Clinical History: LSIL pap, prior CIN1 and VAIN 1     FINAL MICROSCOPIC DIAGNOSIS:  A. VAGINAL BIOPSY: - Focal koilocytic atypia in a background of atrophic change, consistent with low-grade squamous intraepithelial lesion (VaIN1, low-grade dysplasia), see comment       COMMENT:  Immunostain for p16 does not show evidence of high-grade dysplasia.    GROSS DESCRIPTION:  The specimen is received in  formalin and consists of a 0.6 x 0.5 x 0.3 cm piece of tan-pink soft tissue.  The specimen is entirely submitted in 1 cassette.  Lovey Newcomer 02/20/2022)    Final Diagnosis performed by Holley Bouche, MD.   Electronically signed 02/25/2022 Technical and / or Professional components performed at [facility], [address], [place], [place] [address].  Immunohistochemistry Technical component (if applicable) was performed at [facility]. [address], [address], [place], [place] [address].   IMMUNOHISTOCHEMISTRY DISCLAIMER (if applicable): Some of these immunohistochemical stains may have been developed and the performance characteristics determine by [facility]. Some may not have been cleared or approved by the U.S. Food and Drug Administration. The FDA has determined that such clearance or approval is not necessary. This test is used for clinical purposes. It should not be regarded as investigational or for research. This laboratory is certified under the Clinical Laboratory Improvement Amendments of 1988 (CLIA-88) as qualified to perform high complexity clinical laboratory testing.  The controls stained appropriately.       Assessment & Plan:   Problem List Items Addressed This Visit       Digestive   Chronic constipation    Continue miralax. Will consider amitiza or linzess- information provided. Call with any concerns.        Other   Abnormal Pap smear of cervix    Continue to follow with GYN.      Other Visit Diagnoses     Routine general medical examination at a health care facility    -  Primary   Vaccines up to date. Screening labs checked today. Pap, mammo and colonoscopy up to date. Continue diet and exercise. Call with any concerns.    Relevant Orders   CBC with Differential/Platelet   Comprehensive metabolic panel   Lipid Panel w/o Chol/HDL Ratio   Urinalysis, Routine w reflex microscopic   TSH         Follow up plan: Return in about 1 year (around 04/04/2023) for physical.   LABORATORY TESTING:  - Pap smear: up to date  IMMUNIZATIONS:   - Tdap: Tetanus vaccination status reviewed: last tetanus booster within 10 years. - Influenza: Up to date - Pneumovax: Not applicable - Prevnar: Not applicable - COVID: Up to date - HPV: Not applicable - Shingrix vaccine: Not applicable  SCREENING: -Mammogram: Up to date  - Colonoscopy: Up to date  - Bone Density: Up to date   PATIENT COUNSELING:   Advised to take 1 mg of folate supplement per day if capable of pregnancy.   Sexuality: Discussed sexually transmitted diseases, partner selection, use of condoms, avoidance of unintended pregnancy  and contraceptive alternatives.   Advised to avoid cigarette smoking.  I discussed with the patient that most people either abstain from alcohol or drink within safe limits (<=14/week and <=4 drinks/occasion for males, <=7/weeks and <= 3 drinks/occasion for females) and that the risk for alcohol disorders and other health effects rises proportionally with the number of drinks per week and how often a drinker exceeds daily limits.  Discussed cessation/primary prevention of drug use and availability of treatment for abuse.   Diet: Encouraged to adjust caloric intake to maintain  or achieve ideal body weight, to reduce intake of dietary saturated fat and total fat, to limit sodium intake by avoiding high sodium foods and not adding table salt, and to maintain adequate dietary potassium and calcium preferably from fresh fruits, vegetables, and low-fat dairy products.    stressed the importance of regular exercise  Injury prevention: Discussed safety belts, safety helmets, smoke detector, smoking near bedding or upholstery.   Dental health: Discussed importance of regular tooth brushing, flossing, and dental visits.    NEXT PREVENTATIVE PHYSICAL DUE IN 1 YEAR. Return in about 1 year (around 04/04/2023)  for physical.

## 2022-04-03 NOTE — Assessment & Plan Note (Signed)
Continue miralax. Will consider amitiza or linzess- information provided. Call with any concerns.

## 2022-04-04 LAB — COMPREHENSIVE METABOLIC PANEL
ALT: 29 IU/L (ref 0–32)
AST: 23 IU/L (ref 0–40)
Albumin/Globulin Ratio: 2.2 (ref 1.2–2.2)
Albumin: 4.6 g/dL (ref 3.9–4.9)
Alkaline Phosphatase: 84 IU/L (ref 44–121)
BUN/Creatinine Ratio: 17 (ref 9–23)
BUN: 15 mg/dL (ref 6–24)
Bilirubin Total: 1.1 mg/dL (ref 0.0–1.2)
CO2: 24 mmol/L (ref 20–29)
Calcium: 9.5 mg/dL (ref 8.7–10.2)
Chloride: 102 mmol/L (ref 96–106)
Creatinine, Ser: 0.87 mg/dL (ref 0.57–1.00)
Globulin, Total: 2.1 g/dL (ref 1.5–4.5)
Glucose: 80 mg/dL (ref 70–99)
Potassium: 4 mmol/L (ref 3.5–5.2)
Sodium: 140 mmol/L (ref 134–144)
Total Protein: 6.7 g/dL (ref 6.0–8.5)
eGFR: 82 mL/min/{1.73_m2} (ref >59)

## 2022-04-04 LAB — CBC WITH DIFFERENTIAL/PLATELET
Basophils Absolute: 0 10*3/uL (ref 0.0–0.2)
Basos: 1 %
EOS (ABSOLUTE): 0.2 10*3/uL (ref 0.0–0.4)
Eos: 3 %
Hematocrit: 42.8 % (ref 34.0–46.6)
Hemoglobin: 14.3 g/dL (ref 11.1–15.9)
Immature Grans (Abs): 0 10*3/uL (ref 0.0–0.1)
Immature Granulocytes: 0 %
Lymphocytes Absolute: 1.5 10*3/uL (ref 0.7–3.1)
Lymphs: 21 %
MCH: 33.3 pg — ABNORMAL HIGH (ref 26.6–33.0)
MCHC: 33.4 g/dL (ref 31.5–35.7)
MCV: 100 fL — ABNORMAL HIGH (ref 79–97)
Monocytes Absolute: 0.4 10*3/uL (ref 0.1–0.9)
Monocytes: 6 %
Neutrophils Absolute: 4.9 10*3/uL (ref 1.4–7.0)
Neutrophils: 69 %
Platelets: 262 10*3/uL (ref 150–450)
RBC: 4.29 x10E6/uL (ref 3.77–5.28)
RDW: 12.3 % (ref 11.7–15.4)
WBC: 7 10*3/uL (ref 3.4–10.8)

## 2022-04-04 LAB — LIPID PANEL W/O CHOL/HDL RATIO
Cholesterol, Total: 203 mg/dL — ABNORMAL HIGH (ref 100–199)
HDL: 54 mg/dL (ref >39)
LDL Chol Calc (NIH): 124 mg/dL — ABNORMAL HIGH (ref 0–99)
Triglycerides: 143 mg/dL (ref 0–149)
VLDL Cholesterol Cal: 25 mg/dL (ref 5–40)

## 2022-04-04 LAB — TSH: TSH: 1.3 u[IU]/mL (ref 0.450–4.500)

## 2022-04-04 NOTE — Telephone Encounter (Signed)
Unable to refill per protocol, last refill by  provider 04/03/22 for 90 and 3 RF. Will refuse duplicate request.  Requested Prescriptions  Pending Prescriptions Disp Refills   valACYclovir (VALTREX) 500 MG tablet [Pharmacy Med Name: valACYclovir HCl 500 MG Oral Tablet] 90 tablet 3    Sig: TAKE 1 TABLET BY MOUTH DAILY     Antimicrobials:  Antiviral Agents - Anti-Herpetic Passed - 04/03/2022 11:42 PM      Passed - Valid encounter within last 12 months    Recent Outpatient Visits           Yesterday Routine general medical examination at a health care facility   [facility], [place], DO   3 months ago Salivary gland swelling   [place], PA-C   10 months ago Atrophic vaginitis   [place], [place], DO   1 year ago Routine general medical examination at a health care facility   [place], [place], DO   2 years ago Routine general medical examination at a health care facility   [place], Barb Merino, DO       Future Appointments             In 1 year Wynetta Emery, Barb Merino, [place], [facility]

## 2022-04-15 ENCOUNTER — Encounter: Payer: Self-pay | Admitting: Psychiatry

## 2022-05-09 ENCOUNTER — Encounter: Payer: Self-pay | Admitting: Psychiatry

## 2022-05-13 ENCOUNTER — Inpatient Hospital Stay: Payer: BC Managed Care – PPO | Attending: Gynecologic Oncology | Admitting: Psychiatry

## 2022-05-13 ENCOUNTER — Other Ambulatory Visit: Payer: Self-pay

## 2022-05-13 VITALS — BP 109/64 | HR 76 | Temp 98.6°F | Resp 16 | Ht 62.0 in | Wt 123.0 lb

## 2022-05-13 DIAGNOSIS — N890 Mild vaginal dysplasia: Secondary | ICD-10-CM | POA: Insufficient documentation

## 2022-05-13 DIAGNOSIS — N952 Postmenopausal atrophic vaginitis: Secondary | ICD-10-CM | POA: Insufficient documentation

## 2022-05-13 MED ORDER — ESTRADIOL 0.1 MG/GM VA CREA: TOPICAL_CREAM | VAGINAL | 6 refills | Status: DC

## 2022-05-13 NOTE — Progress Notes (Unsigned)
Gynecologic Oncology Return Clinic Visit  Date of Service: 05/13/2022 Referring Provider: Nicholaus Bloom, MD  Assessment & Plan: Heidi Rodriguez is a 51 y.o. 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  Atrophic vagina: - Continue with vaginal estrogen cream. - Discussed that patient could consider increasing to 3 times weekly from 2 times weekly to try to avoid the episodes of exacerbation that she has noted and see how this does. - Continue with coconut oil on 9 estrogen cream nights.  Low-grade vaginal dysplasia: - Recommend repeat Pap in 02/2023. - Discussed that I would be happy to do this at my clinic versus return to her regular OB/GYN for this evaluation.  RTC September 2024 for repeat pap.  Clide Cliff, MD Gynecologic Oncology   Medical Decision Making I personally spent  TOTAL 22 minutes face-to-face and non-face-to-face in the care of this patient, which includes all pre, intra, and post visit time on the date of service.  ----------------------- Reason for Visit: follow-up   Interval History: Since patient's last visit, patient reports that she has been using the vaginal estrogen cream as prescribed.  She reports that she has increased use to daily at times when she becomes more symptomatic with a "sandpaper like feeling".  She reports this has happened only once every few months.  Otherwise, she reports that she is doing well.  She is using the coconut oil on days when she is not using the estrogen.  She denies any vaginal bleeding.   Past Medical/Surgical History: Past Medical History:  Diagnosis Date   Abnormal cytology    CIN I (cervical intraepithelial neoplasia I)    GERD (gastroesophageal reflux disease)    Herpes genitalis    PONV (postoperative nausea and vomiting)    Vulvar intraepithelial neoplasia (VIN) grade 1     Past Surgical History:  Procedure Laterality Date    COLONOSCOPY WITH PROPOFOL N/A 05/25/2020   Procedure: COLONOSCOPY WITH PROPOFOL;  Surgeon: Pasty Spillers, MD;  Location: [facility];  Service: Endoscopy;  Laterality: N/A;   COLPOSCOPY N/A 09/22/2015   Procedure: COLPOSCOPY;  Surgeon: Vena Austria, MD;  Location: [facility];  Service: Gynecology;  Laterality: N/A;   COLPOSCOPY  01/01/2022   COLPOSCOPY VULVA W/ BIOPSY  02/01/2022   EYE SURGERY     LESION REMOVAL N/A 09/22/2015   Procedure: EXCISION VAGINAL LESION;  Surgeon: Vena Austria, MD;  Location: [facility];  Service: Gynecology;  Laterality: N/A;   SALIVARY STONE REMOVAL  02/04/2022   TOTAL ABDOMINAL HYSTERECTOMY W/ BILATERAL SALPINGOOPHORECTOMY      Family History  Problem Relation Age of Onset   Skin cancer Mother 62   Glaucoma Mother    Diabetes Maternal Uncle    Skin cancer Maternal Grandmother    Leukemia Maternal Grandmother    CAD Maternal Grandmother    Glaucoma Maternal Grandmother    CAD Father    Hyperlipidemia Father    Heart disease Father    Atrial fibrillation Father    Obesity Sister    Osteoarthritis Sister    Asthma Sister    Breast cancer Paternal Aunt 65   Cancer Paternal Grandmother     Social History   Socioeconomic History   Marital status: Married    Spouse name: Not on file   Number of children: Not on file   Years of education: Not on file   Highest education level: Not on file  Occupational History  Not on file  Tobacco Use   Smoking status: Never   Smokeless tobacco: Never  Vaping Use   Vaping Use: Never used  Substance and Sexual Activity   Alcohol use: No   Drug use: No   Sexual activity: Yes    Birth control/protection: Surgical    Comment: Hysterectomy  Other Topics Concern   Not on file  Social History Narrative   Not on file   Social Determinants of Health   Financial Resource Strain: Not on file  Food Insecurity: Not on file  Transportation Needs: Not on file  Physical Activity: Not on file   Stress: Not on file  Social Connections: Not on file    Current Medications:  Current Outpatient Medications:    azelastine (ASTELIN) 0.1 % nasal spray, Place 2 sprays into both nostrils 2 (two) times daily. Use in each nostril as directed, Disp: 30 mL, Rfl: 12   Biotin 10 MG CAPS, Take by mouth., Disp: , Rfl:    calcium carbonate 1250 MG capsule, Take 1,250 mg by mouth daily., Disp: , Rfl:    Cholecalciferol (VITAMIN D3) 2000 units capsule, Take by mouth., Disp: , Rfl:    Lactobacillus Acid-Pectin (ACIDOPHILUS/PECTIN) CAPS, Take by mouth., Disp: , Rfl:    Multiple Vitamin (MULTIVITAMIN) capsule, Take 1 capsule by mouth daily., Disp: , Rfl:    Multiple Vitamins-Minerals (ZINC PO), Take by mouth daily., Disp: , Rfl:    NAFTIN 2 % GEL, APPLY DAILY TO AFFECTED AREA(S) PLUS A 0.5 INCH MARGIN TO HEALTHY, SURROUNDING SKIN FOR 2 WEEKS, Disp: 60 g, Rfl: 2   OPZELURA 1.5 % CREA, Apply 1 application topically 2 (two) times daily., Disp: , Rfl:    polyethylene glycol (MIRALAX / GLYCOLAX) 17 g packet, Take 17 g by mouth daily., Disp: , Rfl:    ranitidine (ZANTAC) 75 MG tablet, Take 75 mg by mouth daily as needed for heartburn., Disp: , Rfl:    spironolactone (ALDACTONE) 50 MG tablet, 2(TWO) TABLET(S) ORAL EVERY DAY, Disp: 180 tablet, Rfl: 3   TURMERIC PO, Take by mouth daily., Disp: , Rfl:    valACYclovir (VALTREX) 500 MG tablet, Take 1 tablet (500 mg total) by mouth daily., Disp: 90 tablet, Rfl: 3   vitamin C (ASCORBIC ACID) 500 MG tablet, Take by mouth., Disp: , Rfl:    estradiol (ESTRACE VAGINAL) 0.1 MG/GM vaginal cream, 1 gram vaginally three times weekly and up to every night for a week when needed., Disp: 42.5 g, Rfl: 6  Review of Symptoms: Complete 10-system review is positive for: Pain with intercourse.  Physical Exam: BP 109/64 (BP Location: Left Arm, Patient Position: Sitting)   Pulse 76   Temp 98.6 F (37 C) (Oral)   Resp 16   Ht 5\' 2"  (1.575 m)   Wt 123 lb (55.8 kg)   LMP  (LMP  Unknown)   BMI 22.50 kg/m  General: Alert, oriented, no acute distress. HEENT: Normocephalic, atraumatic. Chest: Normal work of breathing. Extremities: Grossly normal range of motion.  Warm, well perfused.  No edema bilaterally. Skin: No rashes or lesions noted. GU: Normal appearing external genitalia without erythema, excoriation, or lesions.  Speculum exam reveals vaginal mucosa with improved estrogenized appearance, plump.  Bimanual exam reveals smooth vaginal cuff.  Exam chaperoned by Kimberly , CMA  Laboratory & Radiologic Studies: none

## 2022-05-13 NOTE — Patient Instructions (Signed)
It was a pleasure to see you in clinic today. - What you are Heidi Rodriguez is working. Keep up the good work. - You might try increasing the estrogen to 3x per week and see if this helps. - Return visit planned for September 2024.  Thank you very much for allowing me to provide care for you today.  I appreciate your confidence in choosing our Gynecologic Oncology team at [facility].  If you have any questions about your visit today please call our office or send [place] a MyChart message and we will get back to you as soon as possible.

## 2022-05-16 ENCOUNTER — Encounter: Payer: Self-pay | Admitting: Psychiatry

## 2022-05-16 DIAGNOSIS — N890 Mild vaginal dysplasia: Secondary | ICD-10-CM | POA: Insufficient documentation

## 2022-09-12 ENCOUNTER — Encounter: Payer: Self-pay | Admitting: Nurse Practitioner

## 2022-09-12 ENCOUNTER — Ambulatory Visit: Payer: BC Managed Care – PPO | Admitting: Nurse Practitioner

## 2022-09-12 VITALS — BP 98/63 | HR 81 | Temp 97.8°F | Ht 62.01 in | Wt 128.8 lb

## 2022-09-12 DIAGNOSIS — J069 Acute upper respiratory infection, unspecified: Secondary | ICD-10-CM | POA: Diagnosis not present

## 2022-09-12 MED ORDER — HYDROCOD POLI-CHLORPHE POLI ER 10-8 MG/5ML PO SUER: 5.0000 mL | Freq: Every evening | ORAL | 0 refills | Status: DC | PRN

## 2022-09-12 NOTE — Progress Notes (Signed)
BP 98/63   Pulse 81   Temp 97.8 F (36.6 C) (Oral)   Ht 5' 2.01" (1.575 m)   Wt 128 lb 12.8 oz (58.4 kg)   LMP  (LMP Unknown)   SpO2 96%   BMI 23.55 kg/m    Subjective:    Patient ID: Heidi Rodriguez, female    DOB: Nov 28, 1970, 52 y.o.   MRN: [id number]  HPI: Heidi Rodriguez is a 52 y.o. female  Chief Complaint  Patient presents with   ear pressure    X 8 days,now has a cough, stuffy nose . Has been using a neti pot   UPPER RESPIRATORY TRACT INFECTION Worst symptom: symptoms have been ongoing x 8 days Fever: no Cough: yes Shortness of breath: no Wheezing: no Chest pain: no Chest tightness: no Chest congestion: no Nasal congestion: yes Runny nose: yes Post nasal drip: yes Sneezing: no Sore throat: no Swollen glands: no Sinus pressure: yes Headache: no Face pain: no Toothache: no Ear pain: no bilateral Ear pressure: yes bilateral Eyes red/itching:no Eye drainage/crusting: no  Vomiting: no Rash: no Fatigue: yes Sick contacts: no Strep contacts: no  Context: stable Recurrent sinusitis: no Relief with OTC cold/cough medications: yes  Treatments attempted:  delsym, antihistamine and cold/sinus   Relevant past medical, surgical, family and social history reviewed and updated as indicated. Interim medical history since our last visit reviewed. Allergies and medications reviewed and updated.  Review of Systems  Constitutional:  Positive for fatigue. Negative for fever.  HENT:  Positive for congestion, postnasal drip and sinus pressure. Negative for dental problem, ear pain, rhinorrhea, sinus pain, sneezing and sore throat.   Respiratory:  Positive for cough. Negative for shortness of breath and wheezing.   Cardiovascular:  Negative for chest pain.  Gastrointestinal:  Negative for vomiting.  Skin:  Negative for rash.  Neurological:  Negative for headaches.    Per HPI unless specifically indicated above     Objective:    BP 98/63   Pulse 81   Temp 97.8  F (36.6 C) (Oral)   Ht 5' 2.01" (1.575 m)   Wt 128 lb 12.8 oz (58.4 kg)   LMP  (LMP Unknown)   SpO2 96%   BMI 23.55 kg/m   Wt Readings from Last 3 Encounters:  09/12/22 128 lb 12.8 oz (58.4 kg)  05/13/22 123 lb (55.8 kg)  04/03/22 125 lb 3.2 oz (56.8 kg)    Physical Exam Vitals and nursing note reviewed.  Constitutional:      General: She is not in acute distress.    Appearance: Normal appearance. She is normal weight. She is not ill-appearing, toxic-appearing or diaphoretic.  HENT:     Head: Normocephalic.     Right Ear: External ear normal. A middle ear effusion is present.     Left Ear: External ear normal. A middle ear effusion is present.     Nose: Nose normal.     Right Turbinates: Swollen.     Left Turbinates: Swollen.     Right Sinus: No maxillary sinus tenderness or frontal sinus tenderness.     Left Sinus: No maxillary sinus tenderness or frontal sinus tenderness.     Mouth/Throat:     Mouth: Mucous membranes are moist.     Pharynx: Oropharynx is clear. Posterior oropharyngeal erythema present.  Eyes:     General:        Right eye: No discharge.        Left eye: No discharge.  Extraocular Movements: Extraocular movements intact.     Conjunctiva/sclera: Conjunctivae normal.     Pupils: Pupils are equal, round, and reactive to light.  Cardiovascular:     Rate and Rhythm: Normal rate and regular rhythm.     Heart sounds: No murmur heard. Pulmonary:     Effort: Pulmonary effort is normal. No respiratory distress.     Breath sounds: Normal breath sounds. No wheezing or rales.  Musculoskeletal:     Cervical back: Normal range of motion and neck supple.  Skin:    General: Skin is warm and dry.     Capillary Refill: Capillary refill takes less than 2 seconds.  Neurological:     General: No focal deficit present.     Mental Status: She is alert and oriented to person, place, and time. Mental status is at baseline.  Psychiatric:        Mood and Affect: Mood  normal.        Behavior: Behavior normal.        Thought Content: Thought content normal.        Judgment: Judgment normal.     Results for orders placed or performed in visit on 04/03/22  CBC with Differential/Platelet  Result Value Ref Range   WBC 7.0 3.4 - 10.8 x10E3/uL   RBC 4.29 3.77 - 5.28 x10E6/uL   Hemoglobin 14.3 11.1 - 15.9 g/dL   Hematocrit 08.642.8 57.834.0 - 46.6 %   MCV 100 (H) 79 - 97 fL   MCH 33.3 (H) 26.6 - 33.0 pg   MCHC 33.4 31.5 - 35.7 g/dL   RDW 46.912.3 62.911.7 - 52.815.4 %   Platelets 262 150 - 450 x10E3/uL   Neutrophils 69 Not Estab. %   Lymphs 21 Not Estab. %   Monocytes 6 Not Estab. %   Eos 3 Not Estab. %   Basos 1 Not Estab. %   Neutrophils Absolute 4.9 1.4 - 7.0 x10E3/uL   Lymphocytes Absolute 1.5 0.7 - 3.1 x10E3/uL   Monocytes Absolute 0.4 0.1 - 0.9 x10E3/uL   EOS (ABSOLUTE) 0.2 0.0 - 0.4 x10E3/uL   Basophils Absolute 0.0 0.0 - 0.2 x10E3/uL   Immature Granulocytes 0 Not Estab. %   Immature Grans (Abs) 0.0 0.0 - 0.1 x10E3/uL  Comprehensive metabolic panel  Result Value Ref Range   Glucose 80 70 - 99 mg/dL   BUN 15 6 - 24 mg/dL   Creatinine, Ser 4.130.87 0.57 - 1.00 mg/dL   eGFR 82 >24>59 MW/NUU/7.25mL/min/1.73   BUN/Creatinine Ratio 17 9 - 23   Sodium 140 134 - 144 mmol/L   Potassium 4.0 3.5 - 5.2 mmol/L   Chloride 102 96 - 106 mmol/L   CO2 24 20 - 29 mmol/L   Calcium 9.5 8.7 - 10.2 mg/dL   Total Protein 6.7 6.0 - 8.5 g/dL   Albumin 4.6 3.9 - 4.9 g/dL   Globulin, Total 2.1 1.5 - 4.5 g/dL   Albumin/Globulin Ratio 2.2 1.2 - 2.2   Bilirubin Total 1.1 0.0 - 1.2 mg/dL   Alkaline Phosphatase 84 44 - 121 IU/L   AST 23 0 - 40 IU/L   ALT 29 0 - 32 IU/L  Lipid Panel w/o Chol/HDL Ratio  Result Value Ref Range   Cholesterol, Total 203 (H) 100 - 199 mg/dL   Triglycerides 366143 0 - 149 mg/dL   HDL 54 >44>39 mg/dL   VLDL Cholesterol Cal 25 5 - 40 mg/dL   LDL Chol Calc (NIH) 034124 (H) 0 - 99 mg/dL  Urinalysis, Routine w reflex microscopic  Result Value Ref Range   Specific Gravity, UA  1.015 1.005 - 1.030   pH, UA 7.5 5.0 - 7.5   Color, UA Yellow Yellow   Appearance Ur Clear Clear   Leukocytes,UA Negative Negative   Protein,UA Negative Negative/Trace   Glucose, UA Negative Negative   Ketones, UA Negative Negative   RBC, UA Negative Negative   Bilirubin, UA Negative Negative   Urobilinogen, Ur 0.2 0.2 - 1.0 mg/dL   Nitrite, UA Negative Negative   Microscopic Examination Comment   TSH  Result Value Ref Range   TSH 1.300 0.450 - 4.500 uIU/mL      Assessment & Plan:   Problem List Items Addressed This Visit   None Visit Diagnoses     Viral upper respiratory illness    -  Primary   Likely viral in nature. Symtpoms are improving. Will give Tussinex to help with sleep. Will send in medrol dose pak/Antibiotics if not improved.        Follow up plan: Return if symptoms worsen or fail to improve.

## 2022-09-13 ENCOUNTER — Encounter: Payer: Self-pay | Admitting: Nurse Practitioner

## 2022-09-13 ENCOUNTER — Other Ambulatory Visit: Payer: Self-pay | Admitting: Family Medicine

## 2022-09-13 MED ORDER — METHYLPREDNISOLONE 4 MG PO TBPK: ORAL_TABLET | ORAL | 0 refills | Status: DC

## 2022-11-28 ENCOUNTER — Other Ambulatory Visit: Payer: Self-pay | Admitting: Family Medicine

## 2022-11-28 DIAGNOSIS — Z1231 Encounter for screening mammogram for malignant neoplasm of breast: Secondary | ICD-10-CM

## 2022-12-30 ENCOUNTER — Ambulatory Visit: Payer: BC Managed Care – PPO | Admitting: Psychiatry

## 2023-01-07 LAB — LAB REPORT - SCANNED: EGFR: 77

## 2023-01-13 ENCOUNTER — Ambulatory Visit
Admission: RE | Admit: 2023-01-13 | Discharge: 2023-01-13 | Disposition: A | Discharge status: Home / Self Care | Payer: BC Managed Care – PPO | Source: Ambulatory Visit | Attending: Family Medicine | Admitting: Family Medicine

## 2023-01-13 DIAGNOSIS — Z1231 Encounter for screening mammogram for malignant neoplasm of breast: Secondary | ICD-10-CM | POA: Insufficient documentation

## 2023-02-10 ENCOUNTER — Other Ambulatory Visit (HOSPITAL_COMMUNITY)
Admission: RE | Admit: 2023-02-10 | Discharge: 2023-02-10 | Disposition: A | Discharge status: Home / Self Care | Payer: BC Managed Care – PPO | Source: Ambulatory Visit | Attending: Psychiatry | Admitting: Psychiatry

## 2023-02-10 ENCOUNTER — Inpatient Hospital Stay: Payer: BC Managed Care – PPO | Attending: Psychiatry | Admitting: Psychiatry

## 2023-02-10 VITALS — BP 102/63 | HR 74 | Temp 98.0°F | Resp 18 | Ht 62.0 in | Wt 123.4 lb

## 2023-02-10 DIAGNOSIS — N952 Postmenopausal atrophic vaginitis: Secondary | ICD-10-CM | POA: Diagnosis not present

## 2023-02-10 DIAGNOSIS — Z79890 Hormone replacement therapy (postmenopausal): Secondary | ICD-10-CM | POA: Insufficient documentation

## 2023-02-10 DIAGNOSIS — N890 Mild vaginal dysplasia: Secondary | ICD-10-CM | POA: Diagnosis not present

## 2023-02-10 NOTE — Progress Notes (Addendum)
Gynecologic Oncology Return Clinic Visit  Date of Service: 02/10/2023 Referring Provider: Nicholaus Bloom, MD  Assessment & Plan: Heidi Rodriguez is a 52 y.o. 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  Low-grade vaginal dysplasia: - Recommend repeat Pap in 02/2023. Collected today. - Will follow-up results and make recommendations for next steps pending results.  Atrophic vagina: - Continue with vaginal estrogen cream. - continue vaginal coconut oil. - significant improvement in mucosa.  RTC pending pap results.  Clide Cliff, MD Gynecologic Oncology   Medical Decision Making I personally spent  TOTAL 20 minutes face-to-face and non-face-to-face in the care of this patient, which includes all pre, intra, and post visit time on the date of service.  ----------------------- Reason for Visit: follow-up  Treatment History: 10/26/03 ASCUS 05/20/05 NIL 06/11/07 NIL 07/11/08 NIL  07/13/09 NIL HPV negative 08/17/09 Supracervical hysterectomy and BSO (for dermoid) 07/16/10 NIL 07/20/12 NIL HPV positive 08/03/13 NIL HPV negative 08/08/14 LSIL HPV negative 08/23/14 Colposcopy VAIN I 02/16/15 LSIL HPV negative 08/15/15 LSIL HPV negative 08/24/15 Colposcopy VAIN I 09/22/15 OR excision VAIN I left sidewall, ECC CIN I, 12 O'Clock ectocervix CIN I 09/11/16 ASCUS 10/17/16 Colposcopy negative 09/16/17 NIL HPV negative 12/2021 LSIL HPV negative 01/14/2022 colposcopy inadequate, no biopsies 02/19/2022 colposcopy VAIN1  Interval History: Pt reports that she has established care with an integrative medicine provider. She reports that she was diagnosed with metabolic syndrome, low B12, low Vit D. She was started on semaglutide, bioidentical hormones (Biest 70/30 10mg /ml 4 clicks daily; testosterone cream 1% 10mg /ml 1-4 clicks daily; prometrium 100mg  nightly). She is using the vaginal estrogen cream 2 nights  weekly. She is using the coconut oil daily. She feels that this is helped significantly. Denies vaginal bleeding. No vaginal or vulvar irritation.   Past Medical/Surgical History: Past Medical History:  Diagnosis Date   Abnormal cytology    CIN I (cervical intraepithelial neoplasia I)    GERD (gastroesophageal reflux disease)    Herpes genitalis    PONV (postoperative nausea and vomiting)    Vulvar intraepithelial neoplasia (VIN) grade 1     Past Surgical History:  Procedure Laterality Date   COLONOSCOPY WITH PROPOFOL N/A 05/25/2020   Procedure: COLONOSCOPY WITH PROPOFOL;  Surgeon: Pasty Spillers, MD;  Location: [facility];  Service: Endoscopy;  Laterality: N/A;   COLPOSCOPY N/A 09/22/2015   Procedure: COLPOSCOPY;  Surgeon: Vena Austria, MD;  Location: [facility];  Service: Gynecology;  Laterality: N/A;   COLPOSCOPY  01/01/2022   COLPOSCOPY VULVA W/ BIOPSY  02/01/2022   EYE SURGERY     LESION REMOVAL N/A 09/22/2015   Procedure: EXCISION VAGINAL LESION;  Surgeon: Vena Austria, MD;  Location: [facility] OR[facility];  Service: Gynecology;  Laterality: N/A;   SALIVARY STONE REMOVAL  02/04/2022   TOTAL ABDOMINAL HYSTERECTOMY W/ BILATERAL SALPINGOOPHORECTOMY      Family History  Problem Relation Age of Onset   Skin cancer Mother 50   Glaucoma Mother    Diabetes Maternal Uncle    Skin cancer Maternal Grandmother    Leukemia Maternal Grandmother    CAD Maternal Grandmother    Glaucoma Maternal Grandmother    CAD Father    Hyperlipidemia Father    Heart disease Father    Atrial fibrillation Father    Obesity Sister    Osteoarthritis Sister    Asthma Sister    Breast cancer Paternal Aunt 88   Cancer Paternal Grandmother  Social History   Socioeconomic History   Marital status: Married    Spouse name: Not on file   Number of children: Not on file   Years of education: Not on file   Highest education level: Not on file  Occupational History   Not on file  Tobacco  Use   Smoking status: Never   Smokeless tobacco: Never  Vaping Use   Vaping status: Never Used  Substance and Sexual Activity   Alcohol use: No   Drug use: No   Sexual activity: Yes    Birth control/protection: Surgical    Comment: Hysterectomy  Other Topics Concern   Not on file  Social History Narrative   Not on file   Social Determinants of Health   Financial Resource Strain: Not on file  Food Insecurity: Not on file  Transportation Needs: Not on file  Physical Activity: Not on file  Stress: Not on file  Social Connections: Not on file    Current Medications:  Current Outpatient Medications:    azelastine (ASTELIN) 0.1 % nasal spray, Place 2 sprays into both nostrils 2 (two) times daily. Use in each nostril as directed, Disp: 30 mL, Rfl: 12   Biotin 10 MG CAPS, Take by mouth., Disp: , Rfl:    Digestive Enzyme CAPS, 2 tablets 2 (two) times daily before a meal., Disp: , Rfl:    estradiol (ESTRACE VAGINAL) 0.1 MG/GM vaginal cream, 1 gram vaginally three times weekly and up to every night for a week when needed., Disp: 42.5 g, Rfl: 6   Hormone Cream Base CREA, 1 Application daily., Disp: , Rfl:    magnesium 30 MG tablet, Take 240 mg by mouth 2 (two) times daily. Magnesium Glycinate, Disp: , Rfl:    Methylcobalamin (METHYL B-12) 5000 MCG CHEW, 1 tablet daily., Disp: , Rfl:    Misc Natural Products (CORTISOL MANAGER) TABS, 1 tablet daily after supper., Disp: , Rfl:    Multiple Vitamin (MULTIVITAMIN) capsule, Take 1 capsule by mouth daily., Disp: , Rfl:    Multiple Vitamins-Minerals (ZINC PO), Take by mouth daily., Disp: , Rfl:    NAFTIN 2 % GEL, APPLY DAILY TO AFFECTED AREA(S) PLUS A 0.5 INCH MARGIN TO HEALTHY, SURROUNDING SKIN FOR 2 WEEKS, Disp: 60 g, Rfl: 2   NON FORMULARY, 6 tablets daily. Biocidin 3 in AM and 3 in PM, Disp: , Rfl:    NON FORMULARY, 1 tablet daily. D3.K2 complex, Disp: , Rfl:    NON FORMULARY, 1 tablet daily. Proflora 4R, Disp: , Rfl:    NON FORMULARY, 1  spray daily. Biocidin throat spray, Disp: , Rfl:    OPZELURA 1.5 % CREA, Apply 1 application topically 2 (two) times daily., Disp: , Rfl:    polyethylene glycol (MIRALAX / GLYCOLAX) 17 g packet, Take 17 g by mouth daily., Disp: , Rfl:    progesterone (PROMETRIUM) 100 MG capsule, Take 100 mg by mouth daily., Disp: , Rfl:    SEMAGLUTIDE,0.25 OR 0.5MG /DOS, [place], 0.625 mcg once a week., Disp: , Rfl:    Sodium Butyrate POWD, 2 tablets by Does not apply route daily. These are tablets, not powder, Disp: , Rfl:    spironolactone (ALDACTONE) 50 MG tablet, 2(TWO) TABLET(S) ORAL EVERY DAY, Disp: 180 tablet, Rfl: 3   Testosterone 20 % CREA, 1 Application by Does not apply route daily. 0.25-1 mL daily to vaginal area, Disp: , Rfl:    valACYclovir (VALTREX) 500 MG tablet, Take 1 tablet (500 mg total) by mouth daily., Disp:  90 tablet, Rfl: 3  Review of Symptoms: Complete 10-system review is positive for: none  Physical Exam: BP 102/63 (BP Location: Right Arm, Patient Position: Sitting)   Pulse 74   Temp 98 F (36.7 C) (Oral)   Resp 18   Ht 5\' 2"  (1.575 m)   Wt 123 lb 6.4 oz (56 kg)   LMP  (LMP Unknown)   SpO2 100%   BMI 22.57 kg/m  General: Alert, oriented, no acute distress. HEENT: Normocephalic, atraumatic. Chest: Normal work of breathing. Extremities: Grossly normal range of motion.  Warm, well perfused.  No edema bilaterally. Skin: No rashes or lesions noted. GU: Normal appearing external genitalia without erythema, excoriation, or lesions.  Speculum exam reveals vaginal mucosa with improved estrogenized appearance, plump..  Cervix flush with vagina but without lesion. Bimanual exam reveals smooth vaginal mucosa and normal cervix.  Exam chaperoned by Kimberly [place], CMA  Laboratory & Radiologic Studies: none

## 2023-02-10 NOTE — Patient Instructions (Signed)
It was a pleasure to see you in clinic today. - Normal appearing exam today. The estrogen and coconut oil are working well. - I will let you know your pap smear result when available and the plan from there.  Thank you very much for allowing me to provide care for you today.  I appreciate your confidence in choosing our Gynecologic Oncology team at [facility].  If you have any questions about your visit today please call our office or send [place] a MyChart message and we will get back to you as soon as possible.

## 2023-02-14 LAB — CYTOLOGY - PAP
Adequacy: ABSENT
Comment: NEGATIVE
High risk HPV: NEGATIVE

## 2023-02-16 ENCOUNTER — Encounter: Payer: Self-pay | Admitting: Psychiatry

## 2023-02-25 ENCOUNTER — Telehealth: Payer: Self-pay | Admitting: Psychiatry

## 2023-02-25 NOTE — Telephone Encounter (Signed)
Delayed entry note:  Called pt with pap smear result: LSIL, HPV HR neg.  Reviewed that per ESGO guidelines, for patients with persistent LSIL/VAIN1 beyond 2 years without previous HSIL or cancer, it is reasonable to extend screening interval to every 2 to 3 years. Pt has not had a high grade pap or biopsy in the past. And has been HPV negative since 2015.  Will communicate with her Ob/Gyn that she is okay to space out pap smears to hopefully minimize unnecessary interventions. Patient in agreement with plan. Happy to see her back if something changes. All questions answered.

## 2023-03-22 ENCOUNTER — Other Ambulatory Visit: Payer: Self-pay | Admitting: Family Medicine

## 2023-03-24 NOTE — Telephone Encounter (Signed)
Requested Prescriptions  Pending Prescriptions Disp Refills   valACYclovir (VALTREX) 500 MG tablet [Pharmacy Med Name: VALACYCLOVIR HCL 500 MG TABLET] 90 tablet 0    Sig: TAKE 1 TABLET (500 MG TOTAL) BY MOUTH DAILY.     Antimicrobials:  Antiviral Agents - Anti-Herpetic Passed - 03/22/2023  1:12 AM      Passed - Valid encounter within last 12 months    Recent Outpatient Visits           6 months ago Viral upper respiratory illness   [place] [facility] Larae Grooms, NP   11 months ago Routine general medical examination at a health care facility   [facility] [place], [place] P, DO   1 year ago Salivary gland swelling   [facility] [place], Oswaldo Conroy, PA-C   1 year ago Atrophic vaginitis   [place] [facility] [place], Megan P, DO   1 year ago Routine general medical examination at a health care facility   [facility] Dorcas Carrow, DO       Future Appointments             In 2 weeks Dorcas Carrow, DO [facility]

## 2023-04-07 ENCOUNTER — Ambulatory Visit (INDEPENDENT_AMBULATORY_CARE_PROVIDER_SITE_OTHER): Payer: BC Managed Care – PPO | Admitting: Family Medicine

## 2023-04-07 ENCOUNTER — Encounter: Payer: Self-pay | Admitting: Family Medicine

## 2023-04-07 VITALS — BP 101/67 | HR 78 | Ht 62.0 in | Wt 130.8 lb

## 2023-04-07 DIAGNOSIS — Z0000 Encounter for general adult medical examination without abnormal findings: Secondary | ICD-10-CM | POA: Diagnosis not present

## 2023-04-07 DIAGNOSIS — A048 Other specified bacterial intestinal infections: Secondary | ICD-10-CM

## 2023-04-07 MED ORDER — SPIRONOLACTONE 50 MG PO TABS: ORAL_TABLET | ORAL | 3 refills | Status: AC

## 2023-04-07 MED ORDER — VALACYCLOVIR HCL 500 MG PO TABS: 500.0000 mg | ORAL_TABLET | Freq: Every day | ORAL | 0 refills | Status: DC

## 2023-04-07 MED ORDER — AZELASTINE HCL 0.1 % NA SOLN: 2.0000 | Freq: Two times a day (BID) | NASAL | 12 refills | Status: AC

## 2023-04-07 NOTE — Progress Notes (Signed)
BP 101/67   Pulse 78   Ht 5\' 2"  (1.575 m)   Wt 130 lb 12.8 oz (59.3 kg)   LMP  (LMP Unknown)   SpO2 97%   BMI 23.92 kg/m    Subjective:    Patient ID: Heidi Rodriguez, female    DOB: 19-Aug-1970, 52 y.o.   MRN: [id number]  HPI: Heidi Rodriguez is a 52 y.o. female presenting on 04/07/2023 for comprehensive medical examination. Current medical complaints include:  Has been following with a functional medicine doctor. Was diagnosed with h. Pylori and had been on antibiotics. Feeling much better. Mood is improved on the hormones. Feeling better, but still not quite there.   She currently lives with: husband Menopausal Symptoms: yes  Depression Screen done today and results listed below:     04/07/2023    8:29 AM 09/12/2022    1:34 PM 04/03/2022    8:25 AM 01/01/2022    8:30 AM 06/01/2021    9:24 AM  Depression screen PHQ 2/9  Decreased Interest 0 0 0 0 0  Down, Depressed, Hopeless 0 0 0 0 0  PHQ - 2 Score 0 0 0 0 0  Altered sleeping  0 0 0 0  Tired, decreased energy  0 0 0 0  Change in appetite  0 0 0 0  Feeling bad or failure about yourself   0 0 0 0  Trouble concentrating  0 0 0 0  Moving slowly or fidgety/restless  0 0 0 0  Suicidal thoughts  0 0 0 0  PHQ-9 Score  0 0 0 0  Difficult doing work/chores  Not difficult at all  Not difficult at all     Past Medical History:  Past Medical History:  Diagnosis Date   Abnormal cytology    CIN I (cervical intraepithelial neoplasia I)    GERD (gastroesophageal reflux disease)    Herpes genitalis    PONV (postoperative nausea and vomiting)    Vulvar intraepithelial neoplasia (VIN) grade 1     Surgical History:  Past Surgical History:  Procedure Laterality Date   COLONOSCOPY WITH PROPOFOL N/A 05/25/2020   Procedure: COLONOSCOPY WITH PROPOFOL;  Surgeon: Pasty Spillers, MD;  Location: [facility];  Service: Endoscopy;  Laterality: N/A;   COLPOSCOPY N/A 09/22/2015   Procedure: COLPOSCOPY;  Surgeon: Vena Austria, MD;   Location: [facility];  Service: Gynecology;  Laterality: N/A;   COLPOSCOPY  01/01/2022   COLPOSCOPY VULVA W/ BIOPSY  02/01/2022   EYE SURGERY     LESION REMOVAL N/A 09/22/2015   Procedure: EXCISION VAGINAL LESION;  Surgeon: Vena Austria, MD;  Location: [facility];  Service: Gynecology;  Laterality: N/A;   SALIVARY STONE REMOVAL  02/04/2022   TOTAL ABDOMINAL HYSTERECTOMY W/ BILATERAL SALPINGOOPHORECTOMY      Medications:  Current Outpatient Medications on File Prior to Visit  Medication Sig   Biotin 10 MG CAPS Take by mouth.   Digestive Enzyme CAPS 2 tablets 2 (two) times daily before a meal.   estradiol (ESTRACE VAGINAL) 0.1 MG/GM vaginal cream 1 gram vaginally three times weekly and up to every night for a week when needed.   Hormone Cream Base CREA 1 Application daily.   magnesium 30 MG tablet Take 240 mg by mouth 2 (two) times daily. Magnesium Glycinate   Methylcobalamin (METHYL B-12) 5000 MCG CHEW 1 tablet daily.   Misc Natural Products (CORTISOL MANAGER) TABS 1 tablet daily after supper.   Multiple Vitamin (MULTIVITAMIN) capsule Take 1 capsule by mouth  daily.   Multiple Vitamins-Minerals (ZINC PO) Take by mouth daily.   NAFTIN 2 % GEL APPLY DAILY TO AFFECTED AREA(S) PLUS A 0.5 INCH MARGIN TO HEALTHY, SURROUNDING SKIN FOR 2 WEEKS   NON FORMULARY 6 tablets daily. Biocidin 3 in AM and 3 in PM   NON FORMULARY 1 tablet daily. D3.K2 complex   NON FORMULARY 1 tablet daily. Proflora 4R   NON FORMULARY 1 spray daily. Biocidin throat spray   OPZELURA 1.5 % CREA Apply 1 application topically 2 (two) times daily.   polyethylene glycol (MIRALAX / GLYCOLAX) 17 g packet Take 17 g by mouth daily.   progesterone (PROMETRIUM) 100 MG capsule Take 100 mg by mouth daily.   SEMAGLUTIDE,0.25 OR 0.5MG /DOS, [place] 0.625 mcg once a week.   Sodium Butyrate POWD 2 tablets by Does not apply route daily. These are tablets, not powder   Testosterone 20 % CREA 1 Application by Does not apply route daily. 0.25-1 mL  daily to vaginal area   No current facility-administered medications on file prior to visit.    Allergies:  Allergies  Allergen Reactions   Erythromycin Hives   Keflex [Cephalexin] Hives   Penicillin G     Other reaction(s): Unknown    Social History:  Social History   Socioeconomic History   Marital status: Married    Spouse name: Not on file   Number of children: Not on file   Years of education: Not on file   Highest education level: Not on file  Occupational History   Not on file  Tobacco Use   Smoking status: Never   Smokeless tobacco: Never  Vaping Use   Vaping status: Never Used  Substance and Sexual Activity   Alcohol use: No   Drug use: No   Sexual activity: Yes    Birth control/protection: Surgical    Comment: Hysterectomy  Other Topics Concern   Not on file  Social History Narrative   Not on file   Social Determinants of Health   Financial Resource Strain: Not on file  Food Insecurity: Not on file  Transportation Needs: Not on file  Physical Activity: Not on file  Stress: Not on file  Social Connections: Not on file  Intimate Partner Violence: Not on file   Social History   Tobacco Use  Smoking Status Never  Smokeless Tobacco Never   Social History   Substance and Sexual Activity  Alcohol Use No    Family History:  Family History  Problem Relation Age of Onset   Skin cancer Mother 22   Glaucoma Mother    Diabetes Maternal Uncle    Skin cancer Maternal Grandmother    Leukemia Maternal Grandmother    CAD Maternal Grandmother    Glaucoma Maternal Grandmother    CAD Father    Hyperlipidemia Father    Heart disease Father    Atrial fibrillation Father    Obesity Sister    Osteoarthritis Sister    Asthma Sister    Breast cancer Paternal Aunt 24   Cancer Paternal Grandmother     Past medical history, surgical history, medications, allergies, family history and social history reviewed with patient today and changes made to  appropriate areas of the chart.   Review of Systems  Constitutional: Negative.   HENT: Negative.    Eyes: Negative.   Respiratory: Negative.    Cardiovascular: Negative.   Gastrointestinal:  Positive for constipation. Negative for abdominal pain, blood in stool, diarrhea, heartburn, melena, nausea and vomiting.  Genitourinary:  Positive for frequency. Negative for dysuria, flank pain, hematuria and urgency.  Skin:  Positive for rash (resolving with gut treatment). Negative for itching.  Neurological: Negative.   Endo/Heme/Allergies:  Positive for environmental allergies. Negative for polydipsia. Does not bruise/bleed easily.  Psychiatric/Behavioral: Negative.     All other ROS negative except what is listed above and in the HPI.      Objective:    BP 101/67   Pulse 78   Ht 5\' 2"  (1.575 m)   Wt 130 lb 12.8 oz (59.3 kg)   LMP  (LMP Unknown)   SpO2 97%   BMI 23.92 kg/m   Wt Readings from Last 3 Encounters:  04/07/23 130 lb 12.8 oz (59.3 kg)  02/10/23 123 lb 6.4 oz (56 kg)  09/12/22 128 lb 12.8 oz (58.4 kg)    Physical Exam Vitals and nursing note reviewed.  Constitutional:      General: She is not in acute distress.    Appearance: Normal appearance. She is not ill-appearing, toxic-appearing or diaphoretic.  HENT:     Head: Normocephalic and atraumatic.     Right Ear: Tympanic membrane, ear canal and external ear normal. There is no impacted cerumen.     Left Ear: Tympanic membrane, ear canal and external ear normal. There is no impacted cerumen.     Nose: Nose normal. No congestion or rhinorrhea.     Mouth/Throat:     Mouth: Mucous membranes are moist.     Pharynx: Oropharynx is clear. No oropharyngeal exudate or posterior oropharyngeal erythema.  Eyes:     General: No scleral icterus.       Right eye: No discharge.        Left eye: No discharge.     Extraocular Movements: Extraocular movements intact.     Conjunctiva/sclera: Conjunctivae normal.     Pupils: Pupils  are equal, round, and reactive to light.  Neck:     Vascular: No carotid bruit.  Cardiovascular:     Rate and Rhythm: Normal rate and regular rhythm.     Pulses: Normal pulses.     Heart sounds: No murmur heard.    No friction rub. No gallop.  Pulmonary:     Effort: Pulmonary effort is normal. No respiratory distress.     Breath sounds: Normal breath sounds. No stridor. No wheezing, rhonchi or rales.  Chest:     Chest wall: No tenderness.  Abdominal:     General: Abdomen is flat. Bowel sounds are normal. There is no distension.     Palpations: Abdomen is soft. There is no mass.     Tenderness: There is no abdominal tenderness. There is no right CVA tenderness, left CVA tenderness, guarding or rebound.     Hernia: No hernia is present.  Genitourinary:    Comments: Breast and pelvic exams deferred with shared decision making Musculoskeletal:        General: No swelling, tenderness, deformity or signs of injury.     Cervical back: Normal range of motion and neck supple. No rigidity. No muscular tenderness.     Right lower leg: No edema.     Left lower leg: No edema.  Lymphadenopathy:     Cervical: No cervical adenopathy.  Skin:    General: Skin is warm and dry.     Capillary Refill: Capillary refill takes less than 2 seconds.     Coloration: Skin is not jaundiced or pale.     Findings: No bruising, erythema, lesion or  rash.  Neurological:     General: No focal deficit present.     Mental Status: She is alert and oriented to person, place, and time. Mental status is at baseline.     Cranial Nerves: No cranial nerve deficit.     Sensory: No sensory deficit.     Motor: No weakness.     Coordination: Coordination normal.     Gait: Gait normal.     Deep Tendon Reflexes: Reflexes normal.  Psychiatric:        Mood and Affect: Mood normal.        Behavior: Behavior normal.        Thought Content: Thought content normal.        Judgment: Judgment normal.     Results for orders  placed or performed in visit on 02/10/23  Cytology - PAP  Result Value Ref Range   High risk HPV Negative    Adequacy      Satisfactory for evaluation; transformation zone component ABSENT.   Diagnosis - Low grade squamous intraepithelial lesion (LSIL) (A)    Comment Normal Reference Range HPV - Negative       Assessment & Plan:   Problem List Items Addressed This Visit   None Visit Diagnoses     Routine general medical examination at a health care facility    -  Primary   Vaccines up to date. Screening labs checked in July and normal. Pap, mammo and colonoscopy up to date. Continue diet and exercise. Call with any concerns.   H. pylori infection       Will recheck her stool to confirm eradication. Call with any concerns.   Relevant Medications   valACYclovir (VALTREX) 500 MG tablet   Other Relevant Orders   H. pylori antigen, stool        Follow up plan: Return in about 1 year (around 04/06/2024) for physical.   LABORATORY TESTING:  - Pap smear: up to date  IMMUNIZATIONS:   - Tdap: Tetanus vaccination status reviewed: last tetanus booster within 10 years. - Influenza: Up to date - Pneumovax: Not applicable - Prevnar: Not applicable - COVID: Refused - HPV: Up to date - Shingrix vaccine: Not applicable  SCREENING: -Mammogram: Up to date  - Colonoscopy: Up to date   PATIENT COUNSELING:   Advised to take 1 mg of folate supplement per day if capable of pregnancy.   Sexuality: Discussed sexually transmitted diseases, partner selection, use of condoms, avoidance of unintended pregnancy  and contraceptive alternatives.   Advised to avoid cigarette smoking.  I discussed with the patient that most people either abstain from alcohol or drink within safe limits (<=14/week and <=4 drinks/occasion for males, <=7/weeks and <= 3 drinks/occasion for females) and that the risk for alcohol disorders and other health effects rises proportionally with the number of drinks per week  and how often a drinker exceeds daily limits.  Discussed cessation/primary prevention of drug use and availability of treatment for abuse.   Diet: Encouraged to adjust caloric intake to maintain  or achieve ideal body weight, to reduce intake of dietary saturated fat and total fat, to limit sodium intake by avoiding high sodium foods and not adding table salt, and to maintain adequate dietary potassium and calcium preferably from fresh fruits, vegetables, and low-fat dairy products.    stressed the importance of regular exercise  Injury prevention: Discussed safety belts, safety helmets, smoke detector, smoking near bedding or upholstery.   Dental health: Discussed importance of regular  tooth brushing, flossing, and dental visits.    NEXT PREVENTATIVE PHYSICAL DUE IN 1 YEAR. Return in about 1 year (around 04/06/2024) for physical.

## 2023-04-12 LAB — H. PYLORI ANTIGEN, STOOL: H pylori Ag, Stl: NEGATIVE

## 2023-08-25 ENCOUNTER — Encounter: Payer: Self-pay | Admitting: Family Medicine

## 2023-08-25 MED ORDER — ESTRADIOL 0.1 MG/GM VA CREA: TOPICAL_CREAM | VAGINAL | 2 refills | Status: AC

## 2023-09-13 ENCOUNTER — Other Ambulatory Visit: Payer: Self-pay | Admitting: Family Medicine

## 2023-09-15 NOTE — Telephone Encounter (Signed)
 Requested Prescriptions  Pending Prescriptions Disp Refills   valACYclovir (VALTREX) 500 MG tablet [Pharmacy Med Name: VALACYCLOVIR HCL 500 MG TABLET] 90 tablet 1    Sig: TAKE 1 TABLET (500 MG TOTAL) BY MOUTH DAILY.     Antimicrobials:  Antiviral Agents - Anti-Herpetic Failed - 09/15/2023 11:24 AM      Failed - Valid encounter within last 12 months    Recent Outpatient Visits   None

## 2024-06-15 ENCOUNTER — Other Ambulatory Visit: Payer: Self-pay | Admitting: Family Medicine
